# Patient Record
Sex: Female | Born: 1950 | Hispanic: Yes | Smoking: Never smoker
Health system: Western US, Academic
[De-identification: ages and names within clinical notes are randomized; demographics above are authoritative.]

## PROBLEM LIST (undated history)

## (undated) DIAGNOSIS — E119 Type 2 diabetes mellitus without complications: Secondary | ICD-10-CM

## (undated) DIAGNOSIS — I10 Essential (primary) hypertension: Secondary | ICD-10-CM

## (undated) DIAGNOSIS — C50919 Malignant neoplasm of unspecified site of unspecified female breast: Secondary | ICD-10-CM

## (undated) DIAGNOSIS — G43909 Migraine, unspecified, not intractable, without status migrainosus: Secondary | ICD-10-CM

## (undated) HISTORY — DX: Migraine, unspecified, not intractable, without status migrainosus: G43.909

## (undated) HISTORY — DX: Malignant neoplasm of unspecified site of unspecified female breast (CMS-HCC): C50.919

## (undated) HISTORY — DX: Essential (primary) hypertension: I10

## (undated) HISTORY — DX: Type 2 diabetes mellitus without complications (CMS-HCC): E11.9

## (undated) MED ORDER — LIDOCAINE HCL (PF) 1 % IJ SOLN
0.10 mL | INTRAMUSCULAR | Status: AC | PRN
Start: 2017-04-26 — End: ?

## (undated) MED ORDER — LACTATED RINGERS IV SOLN
INTRAVENOUS | Status: AC
Start: 2017-04-26 — End: ?

---

## 1994-03-15 HISTORY — PX: BREAST LUMPECTOMY: SHX2

## 1997-03-15 HISTORY — PX: TOTAL ABDOMINAL HYSTERECTOMY W/ BILATERAL SALPINGOOPHORECTOMY: SHX83

## 2016-11-01 ENCOUNTER — Other Ambulatory Visit: Payer: Self-pay

## 2016-11-19 ENCOUNTER — Other Ambulatory Visit: Payer: Self-pay

## 2017-03-17 ENCOUNTER — Telehealth (HOSPITAL_BASED_OUTPATIENT_CLINIC_OR_DEPARTMENT_OTHER): Payer: Self-pay

## 2017-03-17 DIAGNOSIS — C50911 Malignant neoplasm of unspecified site of right female breast: Principal | ICD-10-CM

## 2017-03-17 NOTE — Telephone Encounter (Signed)
Missing actual imaging reports.       Will fwd to Dr. Benjie Karvonen team to help schedule.

## 2017-03-17 NOTE — Telephone Encounter (Signed)
Nellie called from Dr. Clarita Crane' office. She left vm stating she faxed records and asks to be called to confirm receipt.

## 2017-03-17 NOTE — Telephone Encounter (Signed)
Returned call to [facility] and reached vm. I left message confirming receipt of the records and advised we would review.    Records reviewed and patient is being referred to Breast Surgical. Referral is entered also.    Routing to Breast Surgical team to contact patient.

## 2017-03-18 NOTE — Telephone Encounter (Signed)
Received all records, scanned into epic.

## 2017-03-21 NOTE — Telephone Encounter (Signed)
Spoke with Daughter Dedra Skeens [phone] wants to see Dr. Benjie Karvonen.

## 2017-03-22 NOTE — Telephone Encounter (Signed)
Path slides and imaging are requested.  Patient's daughter Dedra Skeens is contacted to schedule consultation with Dr. Benjie Karvonen, she is also supposed to be scheduled for consultation with a plastic surgeon.  Coordination of these appointments is discussed and Alma who was appreciative of this offering.  Forwarding to Dr. Benjie Karvonen for breast imaging orders and to [facility] for order to review path.  Referring pcp is contacted for referral to plastics.

## 2017-03-22 NOTE — Telephone Encounter (Signed)
Outside path slide review order entered in EPIC per protocol.

## 2017-03-23 ENCOUNTER — Other Ambulatory Visit: Payer: Self-pay

## 2017-03-24 ENCOUNTER — Other Ambulatory Visit
Admit: 2017-03-24 | Discharge: 2017-03-24 | Disposition: A | Discharge status: Home / Self Care | Payer: Medicare Other | Attending: Pathology | Admitting: Pathology

## 2017-03-24 DIAGNOSIS — C50911 Malignant neoplasm of unspecified site of right female breast: Principal | ICD-10-CM

## 2017-03-28 ENCOUNTER — Ambulatory Visit: Payer: Medicare Other | Attending: Surgical Oncology | Admitting: Surgical Oncology

## 2017-03-28 ENCOUNTER — Encounter (HOSPITAL_BASED_OUTPATIENT_CLINIC_OR_DEPARTMENT_OTHER): Payer: Self-pay | Admitting: Surgical Oncology

## 2017-03-28 ENCOUNTER — Other Ambulatory Visit (HOSPITAL_BASED_OUTPATIENT_CLINIC_OR_DEPARTMENT_OTHER): Payer: Self-pay | Admitting: Surgical Oncology

## 2017-03-28 ENCOUNTER — Ambulatory Visit (HOSPITAL_BASED_OUTPATIENT_CLINIC_OR_DEPARTMENT_OTHER)
Admit: 2017-03-28 | Discharge: 2017-03-28 | Disposition: A | Discharge status: Home Health | Payer: Medicare Other | Attending: Surgical Oncology | Admitting: Surgical Oncology

## 2017-03-28 ENCOUNTER — Ambulatory Visit (HOSPITAL_BASED_OUTPATIENT_CLINIC_OR_DEPARTMENT_OTHER)
Admission: RE | Admit: 2017-03-28 | Discharge: 2017-03-28 | Disposition: A | Discharge status: Home Health | Payer: Medicare Other | Attending: Surgical Oncology | Admitting: Surgical Oncology

## 2017-03-28 VITALS — BP 147/57 | HR 83 | Temp 98.5°F | Resp 16 | Ht <= 58 in | Wt 125.0 lb

## 2017-03-28 DIAGNOSIS — C773 Secondary and unspecified malignant neoplasm of axilla and upper limb lymph nodes: Secondary | ICD-10-CM | POA: Insufficient documentation

## 2017-03-28 DIAGNOSIS — C50911 Malignant neoplasm of unspecified site of right female breast: Principal | ICD-10-CM

## 2017-03-28 DIAGNOSIS — C50919 Malignant neoplasm of unspecified site of unspecified female breast: Principal | ICD-10-CM | POA: Insufficient documentation

## 2017-03-28 DIAGNOSIS — N6311 Unspecified lump in the right breast, upper outer quadrant: Secondary | ICD-10-CM

## 2017-03-28 DIAGNOSIS — N6310 Unspecified lump in the right breast, unspecified quadrant: Secondary | ICD-10-CM | POA: Insufficient documentation

## 2017-03-28 MED ORDER — VISION FORMULA 2 PO
ORAL | Status: DC
Start: ? — End: 2019-04-05

## 2017-03-28 MED ORDER — OMEPRAZOLE 40 MG OR CPDR: 40.00 mg | DELAYED_RELEASE_CAPSULE | Freq: Every day | ORAL | Status: AC

## 2017-03-28 MED ORDER — METFORMIN HCL 1000 MG OR TABS: 1000.00 mg | ORAL_TABLET | Freq: Two times a day (BID) | ORAL | Status: AC

## 2017-03-28 MED ORDER — LOSARTAN POTASSIUM-HCTZ 100-25 MG OR TABS: 1.00 | ORAL_TABLET | Freq: Every day | ORAL | Status: AC

## 2017-03-28 MED ORDER — CARBOXYMETH-GLYCERIN-POLYSORB 0.5-1-0.5 % OP SOLN: OPHTHALMIC | Status: AC

## 2017-03-28 MED ORDER — ASPIRIN 81 MG OR TABS
81.00 mg | ORAL_TABLET | Freq: Every day | ORAL | Status: DC
Start: ? — End: 2018-02-06

## 2017-03-28 MED ORDER — SIMVASTATIN 10 MG OR TABS: 10.00 mg | ORAL_TABLET | Freq: Every evening | ORAL | Status: AC

## 2017-03-28 MED ORDER — OMEGA-3 FISH OIL/VITAMIN D3 1000-1000 MG-UNIT PO CAPS
ORAL_CAPSULE | ORAL | Status: DC
Start: ? — End: 2018-02-06

## 2017-03-28 MED ORDER — ONDANSETRON HCL 4 MG OR TABS
4.00 mg | ORAL_TABLET | Freq: Three times a day (TID) | ORAL | Status: DC | PRN
Start: ? — End: 2018-02-06

## 2017-03-28 MED ORDER — GLIMEPIRIDE 4 MG OR TABS: 4.00 mg | ORAL_TABLET | Freq: Every morning | ORAL | Status: AC

## 2017-03-28 MED ORDER — LORAZEPAM 1 MG OR TABS
1.00 mg | ORAL_TABLET | ORAL | 0 refills | Status: AC
Start: 2017-03-28 — End: 2017-03-28

## 2017-03-28 NOTE — Patient Instructions (Signed)
[facility]  PHONE LIST FOR PATIENTS    Hours of operation:  Monday-Friday 8:00 - 5:00pm. Closed Holidays and Weekends    AFTER HOURS EMERGENCY NUMBER:    For urgent questions after hours, or on weekends/holidays, contact the [place] paging operator at [phone], and ask to speak to the doctor on call for Dr. Sarah Blair    To make a follow up appointment, please call the call the [facility] center at: [phone]     For scheduling issues, insurance or disability questions, please call,  Administrative Assistant Lori Morgan:  Phone: [phone], Fax: [phone]     For nursing questions during weekdays, please call:   Leva Baine, RN, BSN, Nurse Case Manager at [phone]    For urgent questions after hours, or on weekends/holidays, contact the [place] paging operator at [phone], and ask to speak to Dr. Sarah Blair, MD on call.    For Medication Refills, allow 3-4 days and fax request to [phone]   Attention: Agapita Savarino, RN    Social Worker: Laurie Knight, BS, MSW, LCSW   Phone: [phone]    Breast Cancer Support Group Schedule 2019  Each 3rd Wednesday of the month (Please note no group in Feb or Dec 2019)   2-3PM   [facility] (Follow the signs)   Group involves gentle movement, best to wear comfy clothes.   RSVP appreciated by calling facilitator Laurie Knight, LCSW [phone]      Information Desk [phone]   ** General information: directions, telephone numbers, or available services.       Administrative Assistant (Medical Oncology):  Kathleen Caldwell                                              Phone:  [phone]  Fax:  [phone]     Breast Cancer Patient Navigator:  Cecilia Kasperick, RN, MSN          Phone: [phone]     Family Cancer  Genetics : [phone]     Occupational Therapy : [phone]     [facility] [phone]   **Call to schedule  imaging appointments     Clinical Trials Office [phone]     [facility] [id number] , [place] [address]   You can also reach us by MyChart     Insurance Contracting: [phone]   MED CENTER Billing: [phone]   [facility] (Physician) Billing: [phone]   Marisela, MCC Financial Counselor: [phone] [email or web address]     For [facility] support services [email or web address]/[email or web address]/[email or web address]     For other support from the American Cancer Society, please visit: http://www.cancer.org/Treatment/SupportProgramsServices/index     Patient and Family Resource Center   Open daily; hours vary   Location: [facility], Room 1066, on the first floor just to the left of the lobby elevators   Phone [phone]

## 2017-03-28 NOTE — Interdisciplinary (Signed)
Consult appointment with Dr. Benjie Karvonen for newly diagnosed right breast cancer with unknown primary site.   Exam done and records reviewed.   Ambulatory. Alert and oriented x 3.   In no acute distress. Mood appropriate.     [place] Spanish interpreter, Marshia Ly, present to translate appt.     Nursing Assessment:   67 year old female who presented with a change on screening mammogram in 10/2016. Pt's last mammogram prior to this one was in 11/2012 Mass noted in 1996 on right breast and pt has lumpectomy and was found to be benign. Pt. Denies any changes in breast size, contour or symmetry, pruritis around breast, nipple discharge, or nipple inversion/direction.     11/01/2016 Bilateral screening mammogram done showing focal asymmetry int he upper right breast. BIRADS 0->    11/19/2016 Diagnostic right breast mammogram and US done showing two suspicious hypoechoic masses at 12:00 measuring 1 x 0.4 x 0.9 cm and 1.3 x 0.4 x 0.8 cm and one mass at 9:00 o'clock measuring 1.2 x 0.7 x 1.1 cm. A markedly abnormal LN in the right axilla is also seen measuring 2.9 x 2.6 x 2.7 cm. A benign 0.3 cm cyst at 10:00 o'clock is also noted. Bx of three masses and LN recommended. BIRADS 4 ->     12/28/16 Right breast mass biopsy:   Benign breast tissue showing stromal fibrosis, fibrocystic change and apocrine metaplasia     01/11/2017 Right axillary LN FNA:   Atypical epithelial cells, highly suspicious for malignancy     Outside slides received [address], [place], [place], "[id number]",01/19/17)   A: Right axillary lymph node, biopsy:   -Metastatic carcinoma, consistent with breast primary, see comment.   COMMENT: The tumor involves multiple fragments. A review of the outside immunohistochemical slides, with their appropriately reactive controls, showed the tumor to be strongly positive for GATA3 and weakly positive for CK7, supporting the rendered diagnosis.   The tumor also displayed the following immunoprofile:   Estrogen  receptor (ER): Positive (3+, >95%)   Progesterone receptor (PR): Negative   Her2/neu (IHC): Negative (score 0, ASCO/CAP 2018)     02/24/17 Right breast biopsy:   Benign breast tissue showing focal atypical ductal hyperplasia and stromal fibrosis.     Pt. Had bone scan and CT of chest done both negative for metastatic disease in 01/2017    Family History of Breast/Ovarian Cancer:   Unknown - pt. adopted    History of Family Cancer:   Unknown - pt. adopted.    Genetic Testing: Denies     Medical History:   Diabetes   HTN   Migraines    Surgical istory:   Lumpectomy in 1996  Hysterectomy and ovary removal in 1999 d/t benign tumor  C-section     Medications and Allergies: reviewed in EPIC     GYN History:   Menarche: 36  G 2P 2  Sexually Active: Not currently  Age at First Birth: 32  LMP: Hysterectomy in 1996  BCP: No  HRT: Yes, on pills x 1 month after hyspterectomy    Social History:     Widow   Occupation: Retired  Ashkenazi Jewish heritage: No   Smoking History: Denies   Alcohol use: Denies   Illicit Drugs: Denies     Interventions:   Supportive care given.   Personal difficulties related to treatment addressed to patient's satisfaction. Patient/family concerns addressed.     Education:   Contact information, AVS given and reviewed.   Questions  asked and answered to patient's satisfaction and understanding     Plan:    Labs ordered, to be done today   Mammogram and Ultrasound already completed  Breast Bx ordered  Breast MRI ordered   F/u after MRI and Bx complete for surgical planning

## 2017-03-28 NOTE — Progress Notes (Signed)
Surgical Oncology:  Demographics:  Date: March 28, 2017  Patient Name: [name]  Medical Record #: [id number]  DOB: 06-Nov-1950   Age: 67 year old    Sex: female    Referring Physician: Tollie Eth, MD     Reason for visit: new surgical consult breast cancer    History of Present Illness:   [name] is a 67 year old female who presents for evaluation of breast cancer with known axillary disease, unknown primary. She has a mass in the Right breast 9:00, N+4cm that measures 2.1cm which attempted biopsy twice at outside facility with benign/ADH findings. Had change on screening mammogram 10/2016, prior mammo 2014. Workup revealed three masses in Right breast and abnormal lymph node. Breast masses all returned benign on biopsy, except LN shows metastatic disease c/w breast primary.     [name] from live interpreter services was present for entire visit for Spanish translation.   Accompanied with her daughter [name]. [facility] recommended mastectomy since primary tumor could not be found on biopsy. They only referred her to [place] [facility] for reconstruction. Today in office she says she does not want to lose the breast, and will do what we recommend. Her daughter relays the pt's husband died 3 years ago from cancer and the patient is very scared and triggered by doctor visits. Daughter scared that waiting longer will allow cancer to spread.      ROS: Denies fevers, chills, unintentional wt loss, new HA, vision change, CP, cough, SOB, N/V, hematuria, dysuria, new masses/lumps, nipple d/c, extremity swelling, bone pain.       03/28/2017:  DIAGNOSTIC MAMMOGRAM WITH DIGITAL BREAST TOMOSYNTHESIS FINDINGS:  The breasts are heterogeneously dense, which may obscure small masses.  In the left breast there are no masses, asymmetries, or suspicious calcifications.  There is an irregular mass measuring 2.1 centimeters in the right breast at 9 o'clock located 4 centimeters from the nipple.  ULTRASOUND FINDINGS:  Real-time transverse and  longitudinal scans were obtained.  Sonography was performed in the right breast at 9 o'clock 4 cm from the nipple in transverse and longitudinal planes. Ultrasound demonstrates an irregular mass measuring 2.1 cm in the right breast at 9 o'clock located 4 centimeters from the nipple. Internal echogenicity is hypoechoic.  Sonography was performed in the right axilla in transverse and longitudinal planes. There are multiple abnormal lymph nodes measuring 2 centimeters in the right axilla.  IMPRESSION / RECOMMENDATION:  There is no evidence of malignancy in the left breast.  1. Mass in the right breast is highly suggestive of malignancy. Biopsy is recommended. There has been an attempt to biopsy this mass at outside facility and there are two marker clips anterior to this mass (not within this mass). one of the outside biopsies were benign and the other one showed ADH. Suggest repeating biopsy of the mass to determine the primary site of cancer.  2. Abnormal lymph nodes in the right axilla are a known malignancy.  Outside biopsy demonstrated metastatic disease in the axilla. Multiple abnormal lymph nodes are seen in the right axilla on today's exam.  ASSESSMENT: BI-RADS Category 5: Highly Suggestive of Malignancy    Pathology (see media), 01/19/2017 :  FINAL PATHOLOGIC DIAGNOSIS:  Outside slides received [address], [place], [place], [id number]  01/19/17)  A: Right axillary lymph node, biopsy:  -Metastatic carcinoma, consistent with breast primary, see comment.  COMMENT: The tumor involves multiple fragments. A review of the outside  immunohistochemical slides, with their appropriately reactive controls,  showed the  tumor to be strongly positive for GATA3 and weakly positive for  CK7, supporting the rendered diagnosis.  The tumor also displayed the following immunoprofile:  Estrogen receptor (ER): Positive (3+, >95%)  Progesterone receptor (PR): Negative  Her2/neu (IHC): Negative (score 0, ASCO/CAP  2018)  SPECIMEN(S) SUBMITTED: A: Outside slide review  CLINICAL HISTORY: 67 year old female with no clinical history. Please  review outside slides.  GROSS DESCRIPTION: A: Received for review from [facility], [place],  [place], are eight slides labeled [phone]" representing right axillary lymph  node biopsy from 01/19/17. Also received is a copy of the surgical  pathology report corresponding to the numbers on the slides. [name]  CONFIDENTIAL HEALTH INFORMATION: Health Care information is personal and  sensitive information. If it is being faxed to you it is done so under  appropriate authorization from the patient or under circumstances that do  not require patient authorization. You, the recipient, are obligated to  maintain it in a safe, secure and confidential manner. Re-disclosure  without additional patient consent or as permitted by law is prohibited.  Unauthorized re-disclosure or failure to maintain confidentiality could  subject you to penalties described in federal and state law. If you have  received this report or facsimile in error, please notify the [place]  [facility]ology Department immediately and destroy the received document(s).  cc: [facility]    Department of Pathology    [place]    [place], [place] [address]    Phone: [phone]  Final Diagnosis performed by Ronnell Guadalajara, M.D. [phone]) Electronically  signed 03/25/17 8:52  Interpretation performed at [facility],  [place], [place], [place], [address] Electronic Signature derived  from a single controlled access password.    Current Medications:    aspirin 81 MG tablet Take 81 mg by mouth daily.   Carboxymeth-Glycerin-Polysorb (REFRESH OPTIVE ADVANCED) 0.5-1-0.5 % SOLN    Fish Oil-Cholecalciferol (OMEGA-3 FISH OIL/VITAMIN D3) 1000-1000 MG-UNIT CAPS    glimepiride (AMARYL) 4 MG tablet Take 4 mg by mouth every morning.   LORazepam (ATIVAN) 1 MG tablet Take 1 tablet (1 mg) by mouth 1 hour before procedure  for 1 dose.   losartan-hydrochlorothiazide (HYZAAR) 100-25 MG tablet Take 1 tablet by mouth daily.   metFORMIN (GLUCOPHAGE) 1000 MG tablet Take 1,000 mg by mouth 2 times daily (with meals).   Multiple Vitamins-Minerals (VISION FORMULA 2 PO)    omeprazole (PRILOSEC) 40 MG capsule Take 40 mg by mouth daily.   ondansetron (ZOFRAN) 4 MG tablet Take 4 mg by mouth every 8 hours as needed for Nausea/Vomiting.   simvastatin (ZOCOR) 10 MG tablet Take 10 mg by mouth every evening.       Allergies:  Patient is allergic to morphine; phenylephrine; and benadryl [diphenhydramine].    Past Medical History:  Patient  has a past medical history of Diabetes mellitus (CMS-HCC), Hypertension, and Migraine.    Past Surgical History:  The patient's  has a past surgical history that includes Cesarean section, classic and Total abdominal hysterectomy w/ bilateral salpingoophorectomy.    Social History:  The patient's  reports that  has never smoked. she has never used smokeless tobacco. She reports that she does not drink alcohol or use drugs.    Family History:  Family History   Family history unknown: Yes       Personal Risk Factors:  Menarche @ age 69  LMP 14 s/p hysterectomy  G2 P2 A0  ; first delivery @ age 35  Denies smoking history  Denies alcohol use  HRT x 1 mo 1990s, stopped  Family History:    Surgical Oncologic History:  Stage at first diagnosis: breast cancer tXn1  Surgical Procedure: p  Chemotherapy: p  Radiation: p    Review of Systems:   See HPI for pertinent positives  Gen: denies fevers/chills, fatigue, unintentional weight loss, night sweats  HEENT: Denies dizziness, vision changes, loss of hearing, nosebleeds, dysphagia  CV: denies chest pain, palpitations, shortness of breath, orthopnea, edema  Resp: denies cough, shortness of breath, hemoptysis  Breast: denies erythema, inflammation, nipple discharge  GI: denies abdominal pain, nausea/vomiting, diarrhea/constipation, or bleeding  GU: denies dysuira, hematuria,  polyuria  Neuro: denies headache, dizziness, loss of consciousness    PHYSICAL EXAM:  BP 147/57 (BP Location: Right arm, BP Patient Position: Sitting, BP cuff size: Large)    Pulse 83    Temp 98.5 F (36.9 C) (Oral)    Resp 16    Ht 4' 5.5" (1.359 m)    Wt 56.7 kg (125 lb 0 oz)    LMP  (LMP Unknown)    SpO2 99%    BMI 30.70 kg/m    GENERAL APPEARANCE: The patient is an alert, female in no acute distress.  SKIN: Without visible lesion  EAR, NOSE, & THROAT:  NC, AT. EOMI. Oropharynx is clear.  LYMPH NODES: There is no cervical, supraclavicular, infraclavicular, or axillary adenopathy.  CHEST: The lungs are clear to auscultation bilaterally.  HEART: Regular rate and rhythm, no murmurs, rubs, or gallops appreciated.  BREASTS: Right breast ecchymosis post biopsy, no palpable masses nodularity nor density, no LAD appreciated, no nipple nor skin changes. Left breast no masses, no LAD, no skin nor nipple changes.   ABDOMEN:  The abdomen is soft, nondistended.  EXTREMITES: There is no edema or cyanosis.  NEURO:  Mental status is normal.  No focal neurologic findings.      ASSESSMENT/PLAN:  In summary Nova Schmuhl is a 67 year old woman who presents with Right breast cancer TXN1  1. Malignant neoplasm of breast, stage 2, unspecified laterality (CMS-HCC)  - CBC w/ Diff Lavender; Future  - Basic Metabolic Panel, Blood Green Plasma Separator Tube; Future  - LORazepam (ATIVAN) 1 MG tablet; Take 1 tablet (1 mg) by mouth 1 hour before procedure for 1 dose.  Dispense: 1 tablet; Refill: 0    - Recommend repeat biopsy of breast mass(es) with [facility].  - MRI ordered for evaluation of undiagnosed breast primary   - Reassured biopsy and MRI are critical to recommendations if pt wishes to attempted breast conservation     RTC after imaging and bx or sooner if needed     -Pt seen alongside Doyce Loose, PA-C who assisted with patient history, orders, counseling and education, and EMR charting. I was present for the entire duration  of the visit and performed the full physical exam in addition to detailing treatment plan. This chart has been reviewed and edited by me as needed.

## 2017-03-28 NOTE — Goals of Care (Signed)
Advance Care Planning    Do you have an Advanced Directive?   No     Would you like more information on Advanced Directives?   Yes    Who would make medical decisions for the patient if they are unable to make decisions for themselves?   Pt's oldest daughter    Based on above information I recommended the following:  completion of an advance directive and reviewing [email or web address]    Total time spent face-to-face with patient and/or surrogate decision maker providing counseling related to advance care planning:   5 minutes

## 2017-03-29 ENCOUNTER — Encounter (HOSPITAL_BASED_OUTPATIENT_CLINIC_OR_DEPARTMENT_OTHER): Payer: Self-pay | Admitting: Surgical Oncology

## 2017-03-29 ENCOUNTER — Other Ambulatory Visit (HOSPITAL_BASED_OUTPATIENT_CLINIC_OR_DEPARTMENT_OTHER): Payer: Self-pay | Admitting: Surgical Oncology

## 2017-03-29 DIAGNOSIS — C50919 Malignant neoplasm of unspecified site of unspecified female breast: Principal | ICD-10-CM

## 2017-03-29 DIAGNOSIS — R928 Other abnormal and inconclusive findings on diagnostic imaging of breast: Secondary | ICD-10-CM

## 2017-03-30 ENCOUNTER — Ambulatory Visit
Admit: 2017-03-30 | Discharge: 2017-03-30 | Disposition: A | Discharge status: Home / Self Care | Payer: Medicare Other | Attending: Anatomic Pathology & Clinical Pathology | Admitting: Anatomic Pathology & Clinical Pathology

## 2017-03-30 DIAGNOSIS — C50911 Malignant neoplasm of unspecified site of right female breast: Principal | ICD-10-CM

## 2017-04-05 ENCOUNTER — Ambulatory Visit (HOSPITAL_BASED_OUTPATIENT_CLINIC_OR_DEPARTMENT_OTHER): Payer: Medicare Other

## 2017-04-05 ENCOUNTER — Ambulatory Visit (HOSPITAL_BASED_OUTPATIENT_CLINIC_OR_DEPARTMENT_OTHER): Admit: 2017-04-05 | Payer: Medicare Other

## 2017-04-07 ENCOUNTER — Ambulatory Visit (HOSPITAL_BASED_OUTPATIENT_CLINIC_OR_DEPARTMENT_OTHER): Payer: Medicare Other

## 2017-04-11 ENCOUNTER — Other Ambulatory Visit (INDEPENDENT_AMBULATORY_CARE_PROVIDER_SITE_OTHER): Payer: Medicare Other

## 2017-04-11 ENCOUNTER — Ambulatory Visit
Admission: RE | Admit: 2017-04-11 | Discharge: 2017-04-11 | Disposition: A | Discharge status: Home / Self Care | Payer: Medicare Other | Attending: Diagnostic Radiology | Admitting: Diagnostic Radiology

## 2017-04-11 ENCOUNTER — Ambulatory Visit (HOSPITAL_BASED_OUTPATIENT_CLINIC_OR_DEPARTMENT_OTHER)
Admit: 2017-04-11 | Discharge: 2017-04-11 | Disposition: A | Discharge status: Home Health | Payer: Medicare Other | Attending: Surgical Oncology | Admitting: Surgical Oncology

## 2017-04-11 DIAGNOSIS — C50919 Malignant neoplasm of unspecified site of unspecified female breast: Secondary | ICD-10-CM | POA: Insufficient documentation

## 2017-04-11 DIAGNOSIS — Z9889 Other specified postprocedural states: Secondary | ICD-10-CM | POA: Insufficient documentation

## 2017-04-11 DIAGNOSIS — N6310 Unspecified lump in the right breast, unspecified quadrant: Secondary | ICD-10-CM | POA: Insufficient documentation

## 2017-04-11 DIAGNOSIS — C773 Secondary and unspecified malignant neoplasm of axilla and upper limb lymph nodes: Secondary | ICD-10-CM

## 2017-04-11 DIAGNOSIS — N6031 Fibrosclerosis of right breast: Secondary | ICD-10-CM | POA: Insufficient documentation

## 2017-04-11 DIAGNOSIS — Z4589 Encounter for adjustment and management of other implanted devices: Secondary | ICD-10-CM | POA: Insufficient documentation

## 2017-04-11 DIAGNOSIS — N6091 Unspecified benign mammary dysplasia of right breast: Secondary | ICD-10-CM

## 2017-04-11 DIAGNOSIS — R928 Other abnormal and inconclusive findings on diagnostic imaging of breast: Secondary | ICD-10-CM

## 2017-04-11 DIAGNOSIS — C50911 Malignant neoplasm of unspecified site of right female breast: Principal | ICD-10-CM

## 2017-04-11 LAB — CBC WITH DIFF, BLOOD
ANC-Automated: 5.2 10*3/uL (ref 1.6–7.0)
ANC-Instrument: 5.2 10*3/uL (ref 1.6–7.0)
Abs Basophils: 0 10*3/uL (ref <0.1)
Abs Eosinophils: 0.2 10*3/uL (ref 0.1–0.5)
Abs Lymphs: 2.9 10*3/uL (ref 0.8–3.1)
Abs Monos: 0.5 10*3/uL (ref 0.2–0.8)
Basophils: 0 %
Eosinophils: 3 %
Hct: 33.9 % — ABNORMAL LOW (ref 34.0–45.0)
Hgb: 11.6 gm/dL (ref 11.2–15.7)
Lymphocytes: 32 %
MCH: 30.2 pg (ref 26.0–32.0)
MCHC: 34.2 g/dL (ref 32.0–36.0)
MCV: 88.3 um3 (ref 79.0–95.0)
MPV: 10.3 fL (ref 9.4–12.4)
Monocytes: 6 %
Plt Count: 332 10*3/uL (ref 140–370)
RBC: 3.84 10*6/uL — ABNORMAL LOW (ref 3.90–5.20)
RDW: 12.5 % (ref 12.0–14.0)
Segs: 59 %
WBC: 8.9 10*3/uL (ref 4.0–10.0)

## 2017-04-11 LAB — BASIC METABOLIC PANEL, BLOOD
Anion Gap: 14 mmol/L (ref 7–15)
BUN: 15 mg/dL (ref 8–23)
Bicarbonate: 26 mmol/L (ref 22–29)
Calcium: 9.6 mg/dL (ref 8.5–10.6)
Chloride: 98 mmol/L (ref 98–107)
Creatinine: 0.75 mg/dL (ref 0.51–0.95)
GFR: >60 mL/min
Glucose: 135 mg/dL — ABNORMAL HIGH (ref 70–99)
Potassium: 3.8 mmol/L (ref 3.5–5.1)
Sodium: 138 mmol/L (ref 136–145)

## 2017-04-11 MED ORDER — GADOBUTROL 1 MMOL/ML IV SOLN
5.00 mL | Freq: Once | INTRAVENOUS | Status: AC
Start: 2017-04-11 — End: 2017-04-11
  Administered 2017-04-11: 5 mL via INTRAVENOUS

## 2017-04-14 ENCOUNTER — Ambulatory Visit (HOSPITAL_BASED_OUTPATIENT_CLINIC_OR_DEPARTMENT_OTHER): Payer: Medicare Other | Admitting: Surgical Oncology

## 2017-04-14 ENCOUNTER — Ambulatory Visit (HOSPITAL_BASED_OUTPATIENT_CLINIC_OR_DEPARTMENT_OTHER): Payer: Medicare Other

## 2017-04-21 ENCOUNTER — Ambulatory Visit (HOSPITAL_BASED_OUTPATIENT_CLINIC_OR_DEPARTMENT_OTHER): Payer: Medicare Other | Admitting: Surgical Oncology

## 2017-04-21 ENCOUNTER — Encounter (HOSPITAL_BASED_OUTPATIENT_CLINIC_OR_DEPARTMENT_OTHER): Payer: Self-pay | Admitting: Surgical Oncology

## 2017-04-21 ENCOUNTER — Ambulatory Visit
Admission: RE | Admit: 2017-04-21 | Discharge: 2017-04-21 | Disposition: A | Discharge status: Home / Self Care | Payer: Medicare Other | Attending: Diagnostic Radiology | Admitting: Diagnostic Radiology

## 2017-04-21 ENCOUNTER — Ambulatory Visit (HOSPITAL_BASED_OUTPATIENT_CLINIC_OR_DEPARTMENT_OTHER)
Admit: 2017-04-21 | Discharge: 2017-04-21 | Disposition: A | Discharge status: Home Health | Payer: Medicare Other | Attending: Surgical Oncology | Admitting: Surgical Oncology

## 2017-04-21 ENCOUNTER — Other Ambulatory Visit (HOSPITAL_BASED_OUTPATIENT_CLINIC_OR_DEPARTMENT_OTHER): Payer: Self-pay | Admitting: Surgical Oncology

## 2017-04-21 VITALS — BP 182/71 | HR 87 | Temp 98.0°F | Resp 16 | Ht <= 58 in | Wt 125.4 lb

## 2017-04-21 DIAGNOSIS — Z9889 Other specified postprocedural states: Secondary | ICD-10-CM | POA: Insufficient documentation

## 2017-04-21 DIAGNOSIS — Z4589 Encounter for adjustment and management of other implanted devices: Secondary | ICD-10-CM | POA: Insufficient documentation

## 2017-04-21 DIAGNOSIS — C50919 Malignant neoplasm of unspecified site of unspecified female breast: Secondary | ICD-10-CM | POA: Insufficient documentation

## 2017-04-21 DIAGNOSIS — N649 Disorder of breast, unspecified: Secondary | ICD-10-CM

## 2017-04-21 NOTE — Patient Instructions (Signed)
[facility]  PHONE LIST FOR PATIENTS    Hours of operation:  Monday-Friday 8:00 - 5:00pm. Closed Holidays and Weekends    AFTER HOURS EMERGENCY NUMBER:    For urgent questions after hours, or on weekends/holidays, contact the [place] paging operator at [phone], and ask to speak to the doctor on call for Dr. Sarah Blair    To make a follow up appointment, please call the call the [facility] center at: [phone]    For scheduling issues, insurance or disability questions, please call,  Administrative Assistant Lori Morgan:  Phone: [phone], Fax: [phone]     For nursing questions during weekdays, please call:   Jacorie Ernsberger, RN, BSN, Nurse Case Manager at [phone]  *Keshun Berrett works Monday - Thursday but there is always a covering nurse covering my phone on Friday's that you can still call     For Medication Refills, allow 3-4 days and fax request to [phone]   Attention: Deklen Popelka, RN    Social Worker: Laurie Knight, BS, MSW, LCSW   Phone: [phone]    Breast Cancer Support Group Schedule 2019  Each 3rd Wednesday of the month (Please note no group in Feb or Dec 2019)   2-3PM   [facility] (Follow the signs)   Group involves gentle movement, best to wear comfy clothes.   RSVP appreciated by calling facilitator Laurie Knight, LCSW [phone]      Information Desk [phone]   ** General information: directions, telephone numbers, or available services.       [facility]: [phone]   **Call to schedule imaging appointments     Administrative Assistant (Medical Oncology):  Kathleen Caldwell                                           Phone:  [phone]  Fax:  [phone]     Breast Cancer Patient Navigator:  Cecilia Kasperick, RN, MSN          Phone: [phone]     Family Cancer  Genetics : [phone]     Occupational Therapy : [phone]     Clinical Trials  Office [phone]     [facility] [address] , [place], [place] [address]      Insurance Contracting: [phone]   MED CENTER Billing: [phone]   [facility] (Physician) Billing: [phone]   Marisela, MCC Financial Counselor: [phone] [email or web address]     For [facility] support services [email or web address]/outreach/[email or web address]/support.asp     For other support from the American Cancer Society, please visit: http://www.cancer.org/Treatment/SupportProgramsServices/index     Patient and Family Resource Center   Open daily; hours vary   Location: [facility], Room 1066, on the first floor just to the left of the lobby elevators   Phone [phone]

## 2017-04-21 NOTE — Progress Notes (Signed)
Surgical Oncology:  Demographics:  Date: April 21, 2017  Patient Name: [name]  Medical Record #: [id number]  DOB: [birth year]   Age: 67 year old    Sex: female    Referring Physician: Tollie Eth, MD     Interpreter id [phone]    Reason for visit: f/u     History of Present Illness:   [name] is a 67 year old female who presents for evaluation of breast cancer with known axillary disease, unknown primary. She has a mass in the Right breast 9:00, N+4cm that measures 2.1cm which attempted biopsy twice at outside facility with benign/ADH findings. Had change on screening mammogram 10/2016, prior mammo 2014. Workup revealed three masses in Right breast and abnormal lymph node. Breast masses all returned benign on biopsy, except LN shows metastatic disease c/w breast primary. Repeat bx in the breast at [facility] was also benign.  We discussed in these cases of + axillary node with unknown primary there is no difference in survival between mast + ALND vs radn and ALND accoeding to small studies.  She wants BCT so I recommended we remove that area with atypia + ALND.  I discussed the case with Dr. Wetzel Bjornstad and she was in favor of surgery first in order to know the number of positive nodes to help guide decision re; chemo       ROS: Denies fevers, chills, unintentional wt loss, new HA, vision change, CP, cough, SOB, N/V, hematuria, dysuria, new masses/lumps, nipple d/c, extremity swelling, bone pain.       03/28/2017:  DIAGNOSTIC MAMMOGRAM WITH DIGITAL BREAST TOMOSYNTHESIS FINDINGS:  The breasts are heterogeneously dense, which may obscure small masses.  In the left breast there are no masses, asymmetries, or suspicious calcifications.  There is an irregular mass measuring 2.1 centimeters in the right breast at 9 o'clock located 4 centimeters from the nipple.  ULTRASOUND FINDINGS:  Real-time transverse and longitudinal scans were obtained.  Sonography was performed in the right breast at 9 o'clock 4 cm from the nipple in  transverse and longitudinal planes. Ultrasound demonstrates an irregular mass measuring 2.1 cm in the right breast at 9 o'clock located 4 centimeters from the nipple. Internal echogenicity is hypoechoic.  Sonography was performed in the right axilla in transverse and longitudinal planes. There are multiple abnormal lymph nodes measuring 2 centimeters in the right axilla.  IMPRESSION / RECOMMENDATION:  There is no evidence of malignancy in the left breast.  1. Mass in the right breast is highly suggestive of malignancy. Biopsy is recommended. There has been an attempt to biopsy this mass at outside facility and there are two marker clips anterior to this mass (not within this mass). one of the outside biopsies were benign and the other one showed ADH. Suggest repeating biopsy of the mass to determine the primary site of cancer.  2. Abnormal lymph nodes in the right axilla are a known malignancy.  Outside biopsy demonstrated metastatic disease in the axilla. Multiple abnormal lymph nodes are seen in the right axilla on today's exam.  ASSESSMENT: BI-RADS Category 5: Highly Suggestive of Malignancy    Pathology (see media), 01/19/2017 :  FINAL PATHOLOGIC DIAGNOSIS:  Outside slides received [address], [place], [place], [id number]",  01/19/17)  A: Right axillary lymph node, biopsy:  -Metastatic carcinoma, consistent with breast primary, see comment.  COMMENT: The tumor involves multiple fragments. A review of the outside  immunohistochemical slides, with their appropriately reactive controls,  showed the tumor to be strongly positive  for GATA3 and weakly positive for  CK7, supporting the rendered diagnosis.  The tumor also displayed the following immunoprofile:  Estrogen receptor (ER): Positive (3+, >95%)  Progesterone receptor (PR): Negative  Her2/neu (IHC): Negative (score 0, ASCO/CAP 2018)  SPECIMEN(S) SUBMITTED: A: Outside slide review  CLINICAL HISTORY: 67 year old female with no clinical history.  Please  review outside slides.  GROSS DESCRIPTION: A: Received for review from [facility], [place],  [place], are eight slides labeled "[id number]" representing right axillary lymph  node biopsy from 01/19/17. Also received is a copy of the surgical  pathology report corresponding to the numbers on the slides. [name]  CONFIDENTIAL HEALTH INFORMATION: Health Care information is personal and  sensitive information. If it is being faxed to you it is done so under  appropriate authorization from the patient or under circumstances that do  not require patient authorization. You, the recipient, are obligated to  maintain it in a safe, secure and confidential manner. Re-disclosure  without additional patient consent or as permitted by law is prohibited.  Unauthorized re-disclosure or failure to maintain confidentiality could  subject you to penalties described in federal and state law. If you have  received this report or facsimile in error, please notify the [facility]  Pathology Department immediately and destroy the received document(s).  cc: [facility]    Department of Pathology    [place]    [place], [place] [address]    Phone: [phone]  Final Diagnosis performed by Ronnell Guadalajara, M.D. [phone]) Electronically  signed 03/25/17 8:52  Interpretation performed at [place],  [place], [place], [place], [address] Electronic Signature derived  from a single controlled access password.    Current Medications:    aspirin 81 MG tablet Take 81 mg by mouth daily.   Carboxymeth-Glycerin-Polysorb (REFRESH OPTIVE ADVANCED) 0.5-1-0.5 % SOLN    Fish Oil-Cholecalciferol (OMEGA-3 FISH OIL/VITAMIN D3) 1000-1000 MG-UNIT CAPS    glimepiride (AMARYL) 4 MG tablet Take 4 mg by mouth every morning.   losartan-hydrochlorothiazide (HYZAAR) 100-25 MG tablet Take 1 tablet by mouth daily.   metFORMIN (GLUCOPHAGE) 1000 MG tablet Take 1,000 mg by mouth 2 times daily (with meals).   Multiple Vitamins-Minerals  (VISION FORMULA 2 PO)    omeprazole (PRILOSEC) 40 MG capsule Take 40 mg by mouth daily.   ondansetron (ZOFRAN) 4 MG tablet Take 4 mg by mouth every 8 hours as needed for Nausea/Vomiting.   simvastatin (ZOCOR) 10 MG tablet Take 10 mg by mouth every evening.       Allergies:  Patient is allergic to morphine; phenylephrine; and benadryl [diphenhydramine].    Past Medical History:  Patient  has a past medical history of Diabetes mellitus (CMS-HCC), Hypertension, and Migraine.    Past Surgical History:  The patient's  has a past surgical history that includes Cesarean section, classic; Total abdominal hysterectomy w/ bilateral salpingoophorectomy (1999); and Breast lumpectomy (1996).    Social History:  The patient's  reports that  has never smoked. she has never used smokeless tobacco. She reports that she does not drink alcohol or use drugs.    Family History:  Family History   Adopted: Yes   Family history unknown: Yes       Personal Risk Factors:  Menarche @ age 54  LMP 54 s/p hysterectomy  G2 P2 A0  ; first delivery @ age 52  Denies smoking history  Denies alcohol use  HRT x 1 mo 1990s, stopped  Family History:  Surgical Oncologic History:  Stage at first diagnosis: breast cancer tXn1  Surgical Procedure: lt image loc alnd  Chemotherapy: p  Radiation: p    Review of Systems:   See HPI for pertinent positives  Gen: denies fevers/chills, fatigue, unintentional weight loss, night sweats  HEENT: Denies dizziness, vision changes, loss of hearing, nosebleeds, dysphagia  CV: denies chest pain, palpitations, shortness of breath, orthopnea, edema  Resp: denies cough, shortness of breath, hemoptysis  Breast: denies erythema, inflammation, nipple discharge  GI: denies abdominal pain, nausea/vomiting, diarrhea/constipation, or bleeding  GU: denies dysuira, hematuria, polyuria  Neuro: denies headache, dizziness, loss of consciousness    PHYSICAL EXAM:  LMP  (LMP Unknown)    GENERAL APPEARANCE: The patient is an alert, female  in no acute distress.  SKIN: Without visible lesion  EAR, NOSE, & THROAT:  NC, AT. EOMI. Oropharynx is clear.  LYMPH NODES: There is no cervical, supraclavicular, infraclavicular, or axillary adenopathy.  CHEST: The lungs are clear to auscultation bilaterally.  HEART: Regular rate and rhythm, no murmurs, rubs, or gallops appreciated.  BREASTS: Right breast ecchymosis post biopsy, no palpable masses nodularity nor density, no LAD appreciated, no nipple nor skin changes. Left breast no masses, no LAD, no skin nor nipple changes.   ABDOMEN:  The abdomen is soft, nondistended.  EXTREMITES: There is no edema or cyanosis.  NEURO:  Mental status is normal.  No focal neurologic findings.      ASSESSMENT/PLAN:  In summary Rinoa Garramone is a 67 year old woman who presents with Right breast cancer TXN1  1. Malignant neoplasm of breast, stage 2, unspecified laterality (CMS-HCC)  1. We decided on image guided lump of atypia and ALND.   I told over that there is no difference in survival if she has mastectomy vs lumpectomy plus radiation. We discussed the risks of bleeding, infection, cosmetic deformity, sensory change ,  lymphedema, missing clip. Additional we discussed  The  risks of surgery including: damage to surrounding structures, wound dehiscence, need for repeat surgery, and associated risks of anaesthesia including myocardial infarction, stroke, deep vein thrombosis, pulmonary embolism, and death were discussed with the patient, and the patient understands these risks. We will get her scheduled She understands that she will be in the hospital overnight and she will have drains for 7-14 days

## 2017-04-21 NOTE — Interdisciplinary (Signed)
Follow up appointment with Dr. Benjie Karvonen   Exam done and records reviewed.   Ambulatory. Alert and oriented x [place] interpreter, Everlene Farrier, present to translate appt.     Nursing assessment:   67 year old female with breast cancer ere her for surgical planning  Pain: denies  Constipation: denies  Fatigue: denies  Appetite: Good   ROM: Good  Recent imaging result reviewed with the patient by Dr. Benjie Karvonen    Wellbeing Assessment :   Patient accompanied by daughter who is translating appt. [place] interpreting services offered and refused.   Transportation issue: None  Barriers to learning: None.  Emotional support given     Interventions:  Supportive care given. Personal difficulties related to treatment addressed to patient's satisfaction. Patient/family concerns addressed.     Education:  Right breast image guided lumpectomy + ALND on educational materials reviewed with and given to pt. With good understanding  Contact information, AVS given and reviewed. Questions asked and answered to patient's satisfaction and understanding    Plan:  -Pt. Consented for Right breast image guided lumpectomy + ALND  -surgery as scheduled  -post-op as scheduled

## 2017-04-26 ENCOUNTER — Encounter (HOSPITAL_BASED_OUTPATIENT_CLINIC_OR_DEPARTMENT_OTHER): Payer: Self-pay | Admitting: Family

## 2017-04-26 ENCOUNTER — Encounter (INDEPENDENT_AMBULATORY_CARE_PROVIDER_SITE_OTHER): Payer: Self-pay | Admitting: Family

## 2017-04-26 ENCOUNTER — Ambulatory Visit: Payer: Medicare Other | Attending: Family | Admitting: Family

## 2017-04-26 DIAGNOSIS — Z01818 Encounter for other preprocedural examination: Secondary | ICD-10-CM

## 2017-04-26 NOTE — Anesthesia Preprocedure Evaluation (Addendum)
ANESTHESIA PRE-OPERATIVE EVALUATION    Patient Information    Name: [name]    MRN: [id number]    DOB: 01-22-1951    Age: 67 year old    Sex: female  Procedure(s):  RIGHT SAVI LOCALIZED LUMPECTOMY  RIGHT AXILLARY LYMPH NODE DISSECTION      Pre-op Vitals:   LMP  (LMP Unknown)         Primary language spoken:  Spanish    ROS/Medical History:       History of Present Illness: 67 y/o F with malignant neoplasm or right breast scheduled for RIGHT SAVI LOCALIZED LUMPECTOMY , RIGHT AXILLARY LYMPH NODE DISSECTION    Phone visit  I spoke with patient in [place] #[id number] [phone]    General:  negative for General ROS   Cardiovascular:  hypertension,  Pacemaker:   >4 METS, walks and completes ADL's, helps at restaurant 1x/wk  No cp or sob   Anesthesia History:  negative anesthesia history ROS   Pulmonary:   negative pulmonary ROS     Neuro/Psych:   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                      Phone interview only   Reviewed AVS  Sent  AVS to patients email  CHG instructions provided               Physical Exam    Airway:      Cardiovascular:      Pulmonary:      Neuro/Neck/Skeletal/Skin:      Dental:      Abdominal:          Additional Clinical Notes:   Other Physical Exam Findings: NO PE DONE            Last  OSA (STOP BANG) Score:  No Data Recorded    Last OSA  (STOP) Score for   No Data Recorded      Has a physician diagnosed you with sleep apnea?: No  Do you use a CPAP at home?: No  OSA total score (A score of 2 or more is high risk. Offer patient sleep study.): 2    Past Medical History:   Diagnosis Date    Diabetes mellitus  (CMS-HCC)     Hypertension     Migraine      Past Surgical History:   Procedure Laterality Date    BREAST LUMPECTOMY  1996    benign mass    CESAREAN SECTION, CLASSIC      TOTAL ABDOMINAL HYSTERECTOMY W/ BILATERAL SALPINGOOPHORECTOMY  1999    d/t benign tumor     Social History     Tobacco Use    Smoking status: Never Smoker    Smokeless tobacco: Never Used   Substance Use Topics    Alcohol use: No     Frequency: Never    Drug use: No       Current Outpatient Medications   Medication Sig Dispense Refill  aspirin 81 MG tablet Take 81 mg by mouth daily.      Carboxymeth-Glycerin-Polysorb (REFRESH OPTIVE ADVANCED) 0.5-1-0.5 % SOLN       Fish Oil-Cholecalciferol (OMEGA-3 FISH OIL/VITAMIN D3) 1000-1000 MG-UNIT CAPS       glimepiride (AMARYL) 4 MG tablet Take 4 mg by mouth every morning.      losartan-hydrochlorothiazide (HYZAAR) 100-25 MG tablet Take 1 tablet by mouth daily.      metFORMIN (GLUCOPHAGE) 1000 MG tablet Take 1,000 mg by mouth 2 times daily (with meals).      Multiple Vitamins-Minerals (VISION FORMULA 2 PO)       omeprazole (PRILOSEC) 40 MG capsule Take 40 mg by mouth daily.      ondansetron (ZOFRAN) 4 MG tablet Take 4 mg by mouth every 8 hours as needed for Nausea/Vomiting.      simvastatin (ZOCOR) 10 MG tablet Take 10 mg by mouth every evening.       No current facility-administered medications for this visit.      Allergies   Allergen Reactions    Morphine Anaphylaxis and Nausea and Vomiting    Phenylephrine Anaphylaxis and Nausea and Vomiting    Benadryl [Diphenhydramine] Nausea Only       Labs and Other Data  Lab Results   Component Value Date    NA 138 04/11/2017    K 3.8 04/11/2017    CL 98 04/11/2017    BICARB 26 04/11/2017    BUN 15 04/11/2017    CREAT 0.75 04/11/2017    GLU 135 (H) 04/11/2017    Roxana 9.6 04/11/2017     No results found for: AST, ALT, GGT, LDH, ALK, TP, ALB, TBILI, DBILI  Lab Results   Component Value Date    WBC 8.9 04/11/2017    RBC 3.84 (L) 04/11/2017    HGB  11.6 04/11/2017    HCT 33.9 (L) 04/11/2017    MCV 88.3 04/11/2017    MCHC 34.2 04/11/2017    RDW 12.5 04/11/2017    PLT 332 04/11/2017    MPV 10.3 04/11/2017    SEG 59 04/11/2017    LYMPHS 32 04/11/2017    MONOS 6 04/11/2017    EOS 3 04/11/2017    BASOS 0 04/11/2017     No results found for: INR, PTT  No results found for: ARTPH, ARTPO2, ARTPCO2    Anesthesia Plan:  Risks and Benefits of Anesthesia  I personally examined the patient immediately prior to the anesthetic and reviewed the pertinent medical history, drug and allergy history, laboratory and imaging studies and consultations. I have determined that the patient has had adequate assessment and testing.    Anesthetic techniques, invasive monitors, anesthetic drugs for induction, maintenance and post-operative analgesia, risks and alternatives have been explained to the patient and/or patient's representatives.    I have prescribed the anesthetic plan:         Planned anesthesia method: General         ASA 3 (Severe systemic disease)     Potential anesthesia problems identified and risks including but not limited to the following were discussed with patient and/or patient's representative: Adverse or allergic drug reaction, Administration of blood products, Recall, Ocular injury, Dental injury or sore throat and Nerve injury    Planned monitoring method: Routine monitoring  Comments: (D/W pt anesthesia options, to be determined dos by provider  )    Informed Consent:  Anesthetic plan and risks discussed with Patient.

## 2017-04-26 NOTE — Patient Instructions (Signed)
[facility] QUIRRGICA PREOPERATORIA     Su operacin est actualmente [employer] [facility] [place] en 05/03/17  Elodia Florence de presentacin planeada es por su [place] de [place], [place], [address], [place], [place] [address]   ? Regstrese en Servicios para pacientes (Patient Services) en el vestbulo del 1er piso.           PREGUNTAS  Si tiene preguntas entre ahora y Games developer de la operacin, no dude en llamarnos:       [facility] Pre[facility] [place]: [place] EL DA DE LA OPERACIN:  El da de la operacin/procedimiento, le rogamos que llegue a la hora que le haya indicado el equipo quirrgico/del preoperatorio. Si tiene preguntas sobre la hora de llegada, llmenos:      Ingresos quirrgicos preoperatorios (Preoperative Surgical Admissions) en [employer] [facility] [place] [phone]                 INSTRUCCIONES PARA LOS MEDICAMENTOS:      MEDICAMENTOS QUE DEBEN SUSPENDERSE 7 DAS ANTES DE LA OPERACIN O PROCEDIMIENTO:       DEJE DE TOMAR TODOS LOS AINEs (anti-inflamatorios no esteroideos) COMO: Advil, Aleve, Motrin, ibuprofeno, relafeno, yodo, feldeno, Diclofenac, voltareno, indometacina, naproxeno, celebrex, Mobic.        Deje de tomar vitaminas, suplementos, hierbas y aceites de pescado.      MEDICAMENTOS CON RECETA REGULARES:       Los medicamentos con receta regulares deben tomarse el da de la operacin con sorbos de agua EXCEPTO METFORMIN, GLIMEPIRIDE, HYZAAR       DESPUS DE SU VISITA A NUESTRO CENTRO, SI EMPIEZA A TOMAR UN NUEVO MEDICAMENTO ANTES DE [place], [place] Y QUE NO TENDR [place] EN SU [place].            COMIDA/BEBIDA       POR FAVOR, NO COMA NI BEBA NADA DESPUS DE LA MEDIANOCHE LA NOCHE ANTES DE LA OPERACIN.     Preparacin para la operacin:       Lleve ropa limpia y [place], y deje los objetos de Neurosurgeon.        No se afeite ni se quite el vello corporal. Se permite afeitarse la cara. Si se va a someter a [place] de cabeza, pregunte a su mdico si puede afeitarse.    Ane Payment tarjeta de identificacin con foto y su tarjeta del seguro, y est preparado para pagar su deducible o el coseguro en efectivo, cheque o tarjeta de crdito al llegar.     Si se va a ir a casa despus de la operacin, asegrese de tener un adulto que le lleve en auto a casa. Si no lo tiene, la operacin podra cancelarse.      El da de la operacin:        Regstrese en el lugar indicado anteriormente.     Si es Art therapist en edad de Media planner, es posible que se le pida que entregue una muestra de orina al Engineer, technical sales.    Se reunir con los equipos quirrgico y de anestesia antes de la operacin.     Una vez terminada la operacin, se despertar en la sala de recuperacin donde podr ver a sus amigos y familiares.     Una vez que finalice su tiempo  en la sala de recuperacin, se ir a casa o se le ingresar, segn lo planeado.    ? Si se va a casa, alguien necesitar estar con usted las primeras 24 horas despus de la operacin.      Hay a su disposicin informacin adicional sobre lo que puede esperar antes y despus de la operacin en lnea en:   https://youtu.be/[email or web address] para [place].     O buscando en YouTube: [place] before surgery ([place]: antes de la operacin) y [place] after surgery ([place]: despus de la operacin).     Sus expedientes mdicos estn a su disposicin en [email or web address]  Seleccione Create account (Crear cuenta).

## 2017-05-03 ENCOUNTER — Inpatient Hospital Stay
Admission: RE | Admit: 2017-05-03 | Discharge: 2017-05-04 | DRG: 580 | Disposition: A | Discharge status: Home / Self Care | Payer: Medicare Other | Attending: Surgical Oncology | Admitting: Surgical Oncology

## 2017-05-03 ENCOUNTER — Inpatient Hospital Stay (HOSPITAL_COMMUNITY): Payer: Medicare Other | Admitting: Anesthesiology

## 2017-05-03 ENCOUNTER — Inpatient Hospital Stay (HOSPITAL_COMMUNITY): Payer: Medicare Other

## 2017-05-03 ENCOUNTER — Encounter (HOSPITAL_COMMUNITY): Admission: RE | Disposition: A | Discharge status: Home Health | Payer: Self-pay | Attending: Surgical Oncology

## 2017-05-03 ENCOUNTER — Inpatient Hospital Stay (HOSPITAL_COMMUNITY): Payer: Medicare Other | Admitting: Family

## 2017-05-03 ENCOUNTER — Encounter (HOSPITAL_COMMUNITY): Payer: Self-pay

## 2017-05-03 DIAGNOSIS — C801 Malignant (primary) neoplasm, unspecified: Secondary | ICD-10-CM

## 2017-05-03 DIAGNOSIS — Z4589 Encounter for adjustment and management of other implanted devices: Secondary | ICD-10-CM

## 2017-05-03 DIAGNOSIS — Z7982 Long term (current) use of aspirin: Secondary | ICD-10-CM

## 2017-05-03 DIAGNOSIS — Z90722 Acquired absence of ovaries, bilateral: Secondary | ICD-10-CM

## 2017-05-03 DIAGNOSIS — C50911 Malignant neoplasm of unspecified site of right female breast: Principal | ICD-10-CM | POA: Diagnosis present

## 2017-05-03 DIAGNOSIS — N6011 Diffuse cystic mastopathy of right breast: Secondary | ICD-10-CM

## 2017-05-03 DIAGNOSIS — Z90710 Acquired absence of both cervix and uterus: Secondary | ICD-10-CM

## 2017-05-03 DIAGNOSIS — Z9889 Other specified postprocedural states: Secondary | ICD-10-CM

## 2017-05-03 DIAGNOSIS — I10 Essential (primary) hypertension: Secondary | ICD-10-CM | POA: Diagnosis present

## 2017-05-03 DIAGNOSIS — E119 Type 2 diabetes mellitus without complications: Secondary | ICD-10-CM | POA: Diagnosis present

## 2017-05-03 DIAGNOSIS — G43909 Migraine, unspecified, not intractable, without status migrainosus: Secondary | ICD-10-CM | POA: Diagnosis present

## 2017-05-03 DIAGNOSIS — C773 Secondary and unspecified malignant neoplasm of axilla and upper limb lymph nodes: Secondary | ICD-10-CM | POA: Diagnosis present

## 2017-05-03 DIAGNOSIS — Z789 Other specified health status: Secondary | ICD-10-CM

## 2017-05-03 DIAGNOSIS — Z885 Allergy status to narcotic agent status: Secondary | ICD-10-CM

## 2017-05-03 DIAGNOSIS — N6091 Unspecified benign mammary dysplasia of right breast: Secondary | ICD-10-CM

## 2017-05-03 DIAGNOSIS — G8918 Other acute postprocedural pain: Secondary | ICD-10-CM

## 2017-05-03 DIAGNOSIS — K5903 Drug induced constipation: Secondary | ICD-10-CM

## 2017-05-03 DIAGNOSIS — Z7984 Long term (current) use of oral hypoglycemic drugs: Secondary | ICD-10-CM

## 2017-05-03 DIAGNOSIS — Z888 Allergy status to other drugs, medicaments and biological substances status: Secondary | ICD-10-CM

## 2017-05-03 LAB — GLYCOSYLATED HGB(A1C), BLOOD: Glyco Hgb (A1C): 7.2 % — ABNORMAL HIGH (ref 4.8–5.8)

## 2017-05-03 LAB — GLUCOSE (POCT)
Glucose (POCT): 151 mg/dL — ABNORMAL HIGH (ref 70–99)
Glucose (POCT): 165 mg/dL — ABNORMAL HIGH (ref 70–99)
Glucose (POCT): 216 mg/dL — ABNORMAL HIGH (ref 70–99)
Glucose (POCT): 286 mg/dL — ABNORMAL HIGH (ref 70–99)

## 2017-05-03 SURGERY — BIOPSY, BREAST, WITH LUMPECTOMY
Anesthesia: General | Site: Breast | Wound class: Class I (Clean)

## 2017-05-03 MED ORDER — FAMOTIDINE 20 MG OR TABS
20.0000 mg | ORAL_TABLET | Freq: Two times a day (BID) | ORAL | Status: DC
Start: 2017-05-03 — End: 2017-05-04
  Administered 2017-05-03 – 2017-05-04 (×2): 20 mg via ORAL
  Filled 2017-05-03 (×2): qty 1

## 2017-05-03 MED ORDER — OXYCODONE HCL 5 MG OR TABS
5.0000 mg | ORAL_TABLET | Freq: Once | ORAL | Status: DC | PRN
Start: 2017-05-03 — End: 2017-05-03

## 2017-05-03 MED ORDER — OXYCODONE HCL 5 MG OR TABS
2.5000 mg | ORAL_TABLET | ORAL | Status: DC | PRN
Start: 2017-05-03 — End: 2017-05-04

## 2017-05-03 MED ORDER — GABAPENTIN 300 MG OR CAPS
300.0000 mg | ORAL_CAPSULE | Freq: Three times a day (TID) | ORAL | Status: DC
Start: 2017-05-04 — End: 2017-05-04
  Administered 2017-05-04: 300 mg via ORAL
  Filled 2017-05-03: qty 1

## 2017-05-03 MED ORDER — DEXTROSE (DIABETIC USE) 40 % OR GEL
1.0000 | ORAL | Status: DC | PRN
Start: 2017-05-03 — End: 2017-05-04

## 2017-05-03 MED ORDER — EPHEDRINE SULFATE 50 MG/ML IJ SOLN
INTRAMUSCULAR | Status: DC | PRN
Start: 2017-05-03 — End: 2017-05-03
  Administered 2017-05-03 (×2): 10 mg via INTRAVENOUS

## 2017-05-03 MED ORDER — ROPIVACAINE HCL 5 MG/ML IJ SOLN
INTRAMUSCULAR | Status: DC | PRN
Start: 2017-05-03 — End: 2017-05-03
  Administered 2017-05-03 (×3): 10 mL via PERINEURAL

## 2017-05-03 MED ORDER — FENTANYL CITRATE (PF) 100 MCG/2ML IJ SOLN
25.0000 ug | INTRAMUSCULAR | Status: DC | PRN
Start: 2017-05-03 — End: 2017-05-03

## 2017-05-03 MED ORDER — LIDOCAINE HCL (PF) 1 % IJ SOLN
0.1000 mL | INTRAMUSCULAR | Status: DC | PRN
Start: 2017-05-03 — End: 2017-05-03

## 2017-05-03 MED ORDER — FAMOTIDINE 20 MG/2ML IV SOLN
INTRAVENOUS | Status: DC | PRN
Start: 2017-05-03 — End: 2017-05-03
  Administered 2017-05-03 (×2): 20 mg via INTRAVENOUS

## 2017-05-03 MED ORDER — PROPOFOL 1000 MG/100ML IV EMUL
INTRAVENOUS | Status: DC | PRN
Start: 2017-05-03 — End: 2017-05-03
  Administered 2017-05-03: 75 ug/kg/min via INTRAVENOUS
  Administered 2017-05-03: 50 ug/kg/min via INTRAVENOUS

## 2017-05-03 MED ORDER — PROPOFOL IV BOLUS 10 MG/ML
INTRAVENOUS | Status: DC | PRN
Start: 2017-05-03 — End: 2017-05-03
  Administered 2017-05-03: 100 mg via INTRAVENOUS

## 2017-05-03 MED ORDER — LABETALOL HCL 5 MG/ML IV SOLN
5.0000 mg | INTRAVENOUS | Status: DC | PRN
Start: 2017-05-03 — End: 2017-05-03

## 2017-05-03 MED ORDER — MIDAZOLAM HCL 2 MG/2ML IJ SOLN
INTRAMUSCULAR | Status: DC | PRN
Start: 2017-05-03 — End: 2017-05-03
  Administered 2017-05-03: 2 mg via INTRAVENOUS

## 2017-05-03 MED ORDER — MEPERIDINE HCL 25 MG/ML IJ SOLN
12.5000 mg | INTRAMUSCULAR | Status: DC | PRN
Start: 2017-05-03 — End: 2017-05-03

## 2017-05-03 MED ORDER — GABAPENTIN 300 MG OR CAPS
300.0000 mg | ORAL_CAPSULE | Freq: Once | ORAL | Status: AC
Start: 2017-05-03 — End: 2017-05-03
  Administered 2017-05-03: 300 mg via ORAL
  Filled 2017-05-03: qty 1

## 2017-05-03 MED ORDER — FAMOTIDINE IN NACL 20 MG/50ML IV SOLN
20.0000 mg | Freq: Two times a day (BID) | INTRAVENOUS | Status: DC
Start: 2017-05-03 — End: 2017-05-04

## 2017-05-03 MED ORDER — DEXTROSE 50 % IV SOLN
12.5000 g | INTRAVENOUS | Status: DC | PRN
Start: 2017-05-03 — End: 2017-05-04

## 2017-05-03 MED ORDER — NALOXONE HCL 0.4 MG/ML IJ SOLN
0.1000 mg | INTRAMUSCULAR | Status: DC | PRN
Start: 2017-05-03 — End: 2017-05-03

## 2017-05-03 MED ORDER — OXYCODONE HCL 5 MG OR TABS
5.0000 mg | ORAL_TABLET | ORAL | Status: DC | PRN
Start: 2017-05-03 — End: 2017-05-04

## 2017-05-03 MED ORDER — FENTANYL CITRATE (PF) 100 MCG/2ML IJ SOLN
50.0000 ug | INTRAMUSCULAR | Status: DC | PRN
Start: 2017-05-03 — End: 2017-05-03

## 2017-05-03 MED ORDER — LACTATED RINGERS IV SOLN
INTRAVENOUS | Status: DC
Start: 2017-05-03 — End: 2017-05-04
  Administered 2017-05-03: 15:00:00 via INTRAVENOUS

## 2017-05-03 MED ORDER — HYDRALAZINE HCL 20 MG/ML IJ SOLN
10.0000 mg | INTRAMUSCULAR | Status: DC | PRN
Start: 2017-05-03 — End: 2017-05-03

## 2017-05-03 MED ORDER — LIDOCAINE HCL (CARDIAC) 20 MG/ML IV SOLN
INTRAVENOUS | Status: DC | PRN
Start: 2017-05-03 — End: 2017-05-03
  Administered 2017-05-03: 40 mg via INTRAVENOUS

## 2017-05-03 MED ORDER — GLUCAGON HCL (RDNA) 1 MG IJ SOLR
1.0000 mg | Freq: Once | INTRAMUSCULAR | Status: DC | PRN
Start: 2017-05-03 — End: 2017-05-04

## 2017-05-03 MED ORDER — SENNA 8.6 MG OR TABS
2.0000 | ORAL_TABLET | Freq: Every evening | ORAL | Status: DC
Start: 2017-05-03 — End: 2017-05-04
  Administered 2017-05-03: 17.2 mg via ORAL
  Filled 2017-05-03: qty 2

## 2017-05-03 MED ORDER — CEFAZOLIN SODIUM 1 GM IJ SOLR
INTRAMUSCULAR | Status: DC | PRN
Start: 2017-05-03 — End: 2017-05-03
  Administered 2017-05-03: 1000 mg via INTRAVENOUS

## 2017-05-03 MED ORDER — NALOXONE HCL 0.4 MG/ML IJ SOLN
0.1000 mg | INTRAMUSCULAR | Status: DC | PRN
Start: 2017-05-03 — End: 2017-05-04

## 2017-05-03 MED ORDER — MIDAZOLAM HCL 2 MG/2ML IJ SOLN
INTRAMUSCULAR | Status: DC | PRN
Start: 2017-05-03 — End: 2017-05-03
  Administered 2017-05-03: 1 mg via INTRAVENOUS

## 2017-05-03 MED ORDER — ACETAMINOPHEN 325 MG PO TABS
975.0000 mg | ORAL_TABLET | Freq: Four times a day (QID) | ORAL | Status: DC
Start: 2017-05-03 — End: 2017-05-03

## 2017-05-03 MED ORDER — CELECOXIB 200 MG OR CAPS
200.0000 mg | ORAL_CAPSULE | Freq: Once | ORAL | Status: AC
Start: 2017-05-03 — End: 2017-05-03
  Administered 2017-05-03: 200 mg via ORAL
  Filled 2017-05-03: qty 1

## 2017-05-03 MED ORDER — HYDROMORPHONE HCL 1 MG/ML IJ SOLN
0.5000 mg | INTRAMUSCULAR | Status: DC | PRN
Start: 2017-05-03 — End: 2017-05-03

## 2017-05-03 MED ORDER — ONDANSETRON HCL 4 MG/2ML IV SOLN
4.0000 mg | Freq: Once | INTRAMUSCULAR | Status: DC | PRN
Start: 2017-05-03 — End: 2017-05-03

## 2017-05-03 MED ORDER — ONDANSETRON HCL 4 MG/2ML IV SOLN
INTRAMUSCULAR | Status: DC | PRN
Start: 2017-05-03 — End: 2017-05-03
  Administered 2017-05-03: 4 mg via INTRAVENOUS

## 2017-05-03 MED ORDER — LACTATED RINGERS IV SOLN
INTRAVENOUS | Status: DC
Start: 2017-05-03 — End: 2017-05-03

## 2017-05-03 MED ORDER — LACTATED RINGERS IV SOLN
INTRAVENOUS | Status: DC | PRN
Start: 2017-05-03 — End: 2017-05-03
  Administered 2017-05-03 (×2): via INTRAVENOUS

## 2017-05-03 MED ORDER — INSULIN LISPRO (HUMAN) 100 UNIT/ML SC SOLN (CUSTOM)
1.0000 [IU] | Freq: Four times a day (QID) | INTRAMUSCULAR | Status: DC
Start: 2017-05-03 — End: 2017-05-04
  Administered 2017-05-03: 2 [IU] via SUBCUTANEOUS
  Administered 2017-05-03: 1 [IU] via SUBCUTANEOUS
  Administered 2017-05-04: 2 [IU] via SUBCUTANEOUS
  Filled 2017-05-03 (×2): qty 2
  Filled 2017-05-03: qty 1

## 2017-05-03 MED ORDER — ACETAMINOPHEN 10 MG/ML IV SOLN
INTRAVENOUS | Status: DC | PRN
Start: 2017-05-03 — End: 2017-05-03
  Administered 2017-05-03: 850 mg via INTRAVENOUS

## 2017-05-03 MED ORDER — ACETAMINOPHEN 325 MG PO TABS
975.0000 mg | ORAL_TABLET | Freq: Four times a day (QID) | ORAL | Status: DC
Start: 2017-05-03 — End: 2017-05-04
  Administered 2017-05-03: 975 mg via ORAL
  Filled 2017-05-03: qty 3

## 2017-05-03 MED ORDER — ONDANSETRON HCL 4 MG/2ML IV SOLN
4.0000 mg | Freq: Four times a day (QID) | INTRAMUSCULAR | Status: DC | PRN
Start: 2017-05-03 — End: 2017-05-04

## 2017-05-03 MED ORDER — FENTANYL CITRATE (PF) 250 MCG/5ML IJ SOLN
INTRAMUSCULAR | Status: DC | PRN
Start: 2017-05-03 — End: 2017-05-03
  Administered 2017-05-03 (×2): 50 ug via INTRAVENOUS

## 2017-05-03 MED ORDER — DOCUSATE SODIUM 250 MG OR CAPS
250.0000 mg | ORAL_CAPSULE | Freq: Every day | ORAL | Status: DC
Start: 2017-05-03 — End: 2017-05-04
  Administered 2017-05-04: 250 mg via ORAL
  Filled 2017-05-03 (×2): qty 1

## 2017-05-03 MED ORDER — LACTATED RINGERS IV SOLN
INTRAVENOUS | Status: DC
Start: 2017-05-03 — End: 2017-05-03
  Administered 2017-05-03: 09:00:00 via INTRAVENOUS

## 2017-05-03 MED ORDER — FENTANYL CITRATE (PF) 250 MCG/5ML IJ SOLN
INTRAMUSCULAR | Status: DC | PRN
Start: 2017-05-03 — End: 2017-05-03
  Administered 2017-05-03: 50 ug via INTRAVENOUS

## 2017-05-03 MED ORDER — SODIUM CHLORIDE 0.9 % IV SOLN
12.5000 mg | Freq: Once | INTRAVENOUS | Status: DC | PRN
Start: 2017-05-03 — End: 2017-05-03

## 2017-05-03 MED ORDER — GLUCOSE 4 GM PO CHEW (CUSTOM)
4.0000 | CHEWABLE_TABLET | ORAL | Status: DC | PRN
Start: 2017-05-03 — End: 2017-05-04

## 2017-05-03 SURGICAL SUPPLY — 56 items
APPLICATOR CHLORAPREP 26ML, ~~LOC~~ (Misc Medical Supply)
APPLIER CLIP MEDIUM LIGACLIP (Staplers and staple reloads) ×3
APPLIER LIGACLIP MCA W/30 MEDIUM TI CLIPS (Staplers and staple reloads) IMPLANT
CAUTERY TIP EDGE BLADE ELECTRODE 2.75", INSULATED (Cautery) ×3 IMPLANT
CONTAINER PRECISION SPECIMEN, 4OZ- STERILE (Misc Medical Supply) ×3 IMPLANT
DEVICE FORCE TRIVERSE 14MM ESU (Cautery) ×3 IMPLANT
DEVICE SPEC MAMO SPECBOARD (Misc Medical Supply) IMPLANT
DRAIN HUBLESS ROUND 19FR 1/4 (Lines/Drains) IMPLANT
DRAIN SUCTION EVAC RELIAVAC 100CC (Lines/Drains) ×3
DRAPE HALF SHEET MEDIUM (Drapes/towels) IMPLANT
DRAPE ORTHO U 76" X 120" (Drapes/towels) ×3 IMPLANT
DRAPE POUCH STERI INST 7X11 (Drapes/towels)
DRAPE POUCH STERI INST 7X11 1018 (Drapes/towels) IMPLANT
DRESSING BIOPATCH OD 1" ID 4MM (Dressings/packing) ×3 IMPLANT
DRESSING SPONGE CURITY 4X4 16 PLY (Dressings/packing) IMPLANT
DRESSING TELFA 3" X 4" STRL (Dressings/packing) IMPLANT
GLOVE BIOGEL INDICATOR UNDERGLOVE SIZE 6.5 (Gloves/gowns) ×6 IMPLANT
GLOVE BIOGEL INDICATOR UNDERGLOVE SIZE 7 (Gloves/gowns) ×6 IMPLANT
GLOVE BIOGEL SUPER-SENSITIVE SIZE 6 (Gloves/gowns) ×6 IMPLANT
GLOVE SURGICAL BIOGEL SIZE 6 (Gloves/gowns) ×6 IMPLANT
GLOVE SURGICAL BIOGEL SIZE 6.5 (Gloves/gowns) ×6 IMPLANT
GOWN MICRO COOL LG BLUE, AAMI LVL 4 (Gloves/Gowns) ×6 IMPLANT
GOWN SURGICAL ULTRA XL BLUE, AAMI LVL 3 (Gloves/Gowns) IMPLANT
HEMOSTAT SURGICEL FIBRILLAR 4" X 4" (Hemostatic agents/wax/sealants-absorbable) ×3 IMPLANT
JMC ONLY- O.R. CAMERA COVER LIGHT, STERILE (Misc Surgical Supply) IMPLANT
LIGASURE SMALL JAW 18.8CM - LF1212A (Lap/Endo/Arthroscopy) ×3 IMPLANT
NEEDLE PROTECT 18G X 1.5" (Needles/punch/cannula/biopsy) IMPLANT
NEEDLE PROTECT 25G X 1.5" (Needles/punch/cannula/biopsy) ×3 IMPLANT
PAD GROUND VALLEYLAB REM ADULT E7507 (Misc Surgical Supply) ×3
PEANUT SPONGES BIOSEAL IN HOLDER 5/PACK (Sponges) ×1
PROTECTOR ULNAR NERVE PAD, YELLOW (Misc Medical Supply)
SHEATH GUIDE CIV-FLEX SAVI-SCOUT (Misc Surgical Supply) ×3
SKIN AFFIX 0.4ML HV (Dressings/packing) IMPLANT
SLEEVE SCD KNEE MEDIUM (Misc Medical Supply) ×3 IMPLANT
SOLUTION IRR POUR BTL 0.9% NS 1000ML (Non-Pharmacy Meds/Solutions) ×3 IMPLANT
SOLUTION IRR POUR BTL H20 1000ML (Non-Pharmacy Meds/Solutions) ×3 IMPLANT
SPONGE LAP RF DETECT 18" X 18" XRAY STERILE (Sponges) ×3 IMPLANT
SPONGES BIOSEAL PEANUT IN HOLDER 5/PACK (Sponges) ×3
STAPLER PROXIMATE SKIN 35 WIDE (Staplers and staple reloads) ×3 IMPLANT
STOCKINETTE TUBULAR 4" X 48" STERILE (Dressings/packing) ×3 IMPLANT
STRIP MEDI-STRIP SKIN CLOSURE 1/2 X 4" (Dressings/packing) IMPLANT
STRIP SKIN CLOSURE 1/2 X 4 (Dressings/packing)
SUPPORT MAMMARY LG (External orthotic collars/braces/supports) ×3 IMPLANT
SURGICAL PACK BASIC MAJOR SET-UP (Procedure Packs/kits) ×3 IMPLANT
SUTURE ETHILON 3-0 18" FS-1 (Suture) IMPLANT
SUTURE ETHILON 3-0 18" PS-2 (Suture) ×3 IMPLANT
SUTURE MONOCRYL 4-0 18" PS-2 (Y496) (Suture) IMPLANT
SUTURE MONOCRYL PLUS 4-0 27" PS-2 MCP426 (Suture) ×6 IMPLANT
SUTURE MONOSOF 3-0 18" C-14 (Suture) ×3 IMPLANT
SUTURE PERMA-HAND SILK 2-0 18" FS 685H (Suture) ×3 IMPLANT
SUTURE VICRYL 3-0 18 TIES (Suture) ×3 IMPLANT
SUTURE VICRYL PLUS 2-0 27" SH VCP417 (Suture) ×6 IMPLANT
SUTURE VICRYL PLUS 3-0 27" SH VCP416 (Suture) ×9 IMPLANT
SYRINGE 10CC LL CONTRL (Misc Medical Supply)
SYRINGE HYPO LL 10CC (Needles/punch/cannula/biopsy) ×3
TOWELS OR BLUE 4-PACK STERILE, DISPOSABLE (Drapes/towels) ×6 IMPLANT

## 2017-05-03 NOTE — Interdisciplinary (Signed)
05/03/17 1540   Initial Assessment   CM Initial Assessment Completed   Patient Information   Where was the patient admitted from? Home   Prior to Level of Function Ambulatory/Independent with ADL's   Assistive Device [place];Wheelchair   Prior [employer]) Family   Primary Contact Name, Number and Relationship Mischele Detter, Daughter, [phone]   Permission to [place] Other (Comment)   Sales executive History Not [place] Affiliation No   Discharge Planning   Living Arrangements Spouse /Significant Other   Available Assistance/Support System Children   Type of Residence [place] No   Additional Services Not Applicable   Barriers to Discharge Awaiting clinical improvement   Patient/Family Engaged in Discharge Planning Yes   Patient Has Decision Making Capacity Yes   Patient/Family/Legal/Surrogate Decision Maker Has Been Given Options And Choice In The Selection of Post-Acute Care Providers Not Applicable   Family/Caregiver's Assessed for Readiness, willingness, and ability to provide or support self-management activities;Readiness to provide care to the patient   Respite Care Not Applicable   Public Health Clearance Needed No   Social Worker Consult   Do you need to see a Education officer, museum?  No   Readmission Risk Assessment   Readmission Within 30 Days of Discharge No   Recent Hospitalizations (Within Last 6 Months) No   High Risk For Readmission No     CM met with patient at bedside and confirmed phone number and address on face sheet.    PCP: Dr. Tollie Eth    RX: [facility] [address] [place], [place] [address] Phone: [phone]    DME: Has cane and wheelchair    SNF: No    Home Health: No    Transport home via: Private transportation with daughter Tyrihanna Wingert    DCP: Home with daughter. CM to follow for discharge planning needs.

## 2017-05-03 NOTE — H&P (Signed)
HISTORY & PHYSICAL - INTERVAL ASSESSMENT    **ONLY TO BE USED IN ADDITION TO A HISTORY & PHYSICAL**    [name]  [id number]      [name] is a 67 year old female presents for scheduled surgery today of Rightl ump + ALND     Denies fever, chills, cough, ST, flu-like symptoms, recent infections, CP, SOB.  Allergies morphine - ANAPHYLAXIS  Overall feeling well. No major complaints today. Ready to proceed.     History taken from EMR, edited as needed by me:   [name] is a 67 year old female who presents for evaluation of breast cancer with known axillary disease, unknown primary. She has a mass in the Right breast 9:00, N+4cm that measures 2.1cm which attempted biopsy twice at outside facility with benign/ADH findings. Had change on screening mammogram 10/2016, prior mammo 2014. Workup revealed three masses in Right breast and abnormal lymph node. Breast masses all returned benign on biopsy, except LN shows metastatic disease c/w breast primary. Repeat bx in the breast at [facility] was also benign.  We discussed in these cases of + axillary node with unknown primary there is no difference in survival between mast + ALND vs radn and ALND accoeding to small studies.  She wants BCT so I recommended we remove that area with atypia + ALND.  I discussed the case with Dr. Wetzel Bjornstad and she was in favor of surgery first in order to know the number of positive nodes to help guide decision re; chemo      Current Medical Status:  Unchanged    Medications / Allergies:  Unchanged    Review of Systems:  Unchanged    Physical Examination:  I have examined the patient today.  Unchanged    Laboratory or Clinical Data:  Unchanged    Modifications of Initial Care Plan:  Unchanged        Julianne Handler     05/03/17     8:57 AM  Pager [phone]

## 2017-05-03 NOTE — Brief Op Note (Addendum)
BRIEF OPERATIVE NOTE    DATE: 05/03/2017  TIME: 1:20 PM    PREOPERATIVE DIAGNOSIS: right axillary lymph node positive for breast cancer, right breast mass    POSTOPERATIVE DIAGNOSIS: same    PROCEDURE INFORMATION:  Procedure(s):  RIGHT SAVI LOCALIZED LUMPECTOMY - Wound Class: Class I (Clean) - Incision Closure: Deep and Superficial Layers  RIGHT AXILLARY LYMPH NODE DISSECTION - Wound Class: Class I (Clean) - Incision Closure: Deep and Superficial Layers    ATTENDING SURGEON:   Surgeon(s) and Role:     Remer Macho, MD - Primary    ANESTHESIA: LMA, regional block    FINDINGS:   Right breast tissue with RF and clips in place  Right axillary contents with several enlarged lymph nodes    SPECIMENS:   ID Type Source Tests Collected by Time Destination   A : RIGHT BREAST LUMPECTOMY. SINGLE SUPERIOR. DOUBLE LATERAL.  Lumpectomy Breast, Right PATHOLOGY TISSUE EXAM Remer Macho, MD 05/03/2017 1145    B : Right Axillary Contents Tissue Breast, Right PATHOLOGY TISSUE EXAM Remer Macho, MD 05/03/2017 1201        Fluids/Blood Products:      IV Fluids: 1000cc    Blood Products: none    EBL: 5cc    Urine Output: not recorded    COMPLICATIONS: none    DISPOSITION: PACU

## 2017-05-03 NOTE — Plan of Care (Signed)
Problem: Promotion of Health and Safety  Goal: Promotion of Health and Safety  The patient remains safe, receives appropriate treatment and achieves optimal outcomes (physically, psychosocially, and spiritually) within the limitations of the disease process by discharge.    Information below is the current care plan.  Outcome: Progressing   05/03/17 1800   Patient /Family stated Goal   Patient /Family stated Goal to be comfortable    Adult/Peds Plan of Care   Guidelines Inpatient Nursing Guidelines   Individualized Interventions/Recommendations #1 Fall precautions implemented. Bed in lowest position. Call bell within reach.    Individualized Interventions/Recommendations #2 (if applicable) Patient is spanish speaking but son and daughter are at bedside to translate.    Individualized Interventions/Recommendations #3 (if applicable) Patient started on a clear liquid diet. Has tolerated it thus far.    Individualized Interventions/Recommendations #4 (if applicable) Patient declines desire for pain medication at this time.    Outcome Evaluation (rationale for progressing/not progressing) every shift Patient's VSS. Declines pain medication at this time. Will continue to monitor.

## 2017-05-03 NOTE — Plan of Care (Signed)
Problem: Promotion of Perioperative Health and Safety  Goal: Promotion of Health and Safety of the Perioperative Patient  The patient remains safe, receives treatment appropriate to the surgical intervention and patient's physiological needs and is discharged or transferred to the appropriate level of care.    Information below is the current care plan.   05/03/17 1407 05/03/17 1410   Perioperative Plan of Care   Guidelines --  PACU   Individualized Interventions/Recommendations #1 --  minimal pain    Outcome Evaluation (rationale for progressing/not progessing) every shift --  pt reports minimal pain    Patient /Family stated Goal   Patient /Family stated Goal surgery  --

## 2017-05-03 NOTE — Op Note (Signed)
DATE OF SERVICE:  05/03/2017    PREOPERATIVE DIAGNOSIS:  Right breast cancer.    POSTOPERATIVE DIAGNOSIS:  Right breast cancer.    PROCEDURE PERFORMED:  Right image-guided lumpectomy, axillary node  dissection.     SURGEON/STAFF:  Remer Macho, MD    ASSISTANT:  Sarajane Marek, MD.    ANESTHESIA:  LMA anesthesia.    FLUIDS:  Crystalloid.    ESTIMATED BLOOD LOSS:  50 cc.    DRAINS:  One 19-French Blake drain.    COMPLICATIONS:  None.    INDICATIONS:  Patient presented with a change in mammogram, which  showed axillary mass that was biopsied and came back as a metastatic  lymph node from a breast primary.  She had several biopsies in the  breast which came back as atypia, so essentially an unknown primary,  but since she had atypia we are going to excise that as well.  We  talked about the risks and benefits of surgery including bleeding,  infection, possible cosmetic deformity, possible sensory change,  possible lymphedema, possible reoperation, possible DVT, PE, MI,  arrhythmias.  She wished to proceed.     PROCEDURE IN DETAIL:  Patient was brought to the operating room and  placed in supine position.  General anesthesia was induced.  LMA was  inserted.  She got a gram of Ancef.  She was prepped and draped in  usual sterile fashion.  We called a time-out confirming we were  operating on correct patient, correct procedure.  We made a 4 cm  incision in the mid outer quadrant on the right breast with a knife,  went through subcuticular and subcutaneous tissue with  electrocautery, raised skin flaps superior and inferiorly.  We used a  Nurse, children's to identify the reflector.  We removed the  specimen en bloc.  We sent it to the Bioptics machine, which showed 3  clips in the reflector in the specimen.  Then we packed that and then  turned our attention to the axilla where we made a 5 cm incision  below the hair-bearing area in the right axilla with a knife, went through  subcuticular and subcutaneous tissue  with electrocautery, opened the  axillary fascia.  We dissected along the pectoralis major muscle.  We  put Richardson retractors below it and took soft tissue away from the  chest wall and identified the long thoracic nerve, dissected that  away with a peanut.  With a combination of blunt and sharp  dissection, identified the axillary vein and then we went laterally  and identified the latissimus muscle, went just medial to that,  identified the thoracodorsal bundle.  Then we went from lateral to  medial on the axillary vein, coagulating small vessels and  lymphatics.  We had to take the intercostal brachial nerve due to a  very enlarged, about 4 cm, lymph node that was encasing the  intercostal brachial nerves.  We took this with a LigaSure, but we  kept the long thoracic and thoracodorsal nerves in view at all times  not to injure them.  We skeletonized the thoracodorsal bundle and  medially we saved the median pectoral nerve at level 2.  Then we  irrigated both sites, got hemostasis with electrocautery.  Then we  put a 19-French Blake drain under direct vision in the   axilla, secured it with a nylon.  Then we closed the axilla with 2-0  Vicryl and 3-0 and 4-0 Monocryl for skin.  We closed  the deep breast  tissue with 2-0 Vicryl, 3-0 Monocryl for skin, Dermabond, Steris,  fluffs, and a bra.  The patient tolerated the procedure well.  She  was discharged to recovery in stable condition.  Sponge counts  correct x2.       Job #:  [id number]

## 2017-05-03 NOTE — Anesthesia Procedure Notes (Signed)
Regional Anesthesia Block Procedure Note  Date & Time:     Short Procedural Summary (Full description of each separate nerve block  below):  Procedure #1 -  Thoracic Paravertebral.    Laterality - Right.    Block type - Single injection(s).                          Medications Administrations:      Indications  Procedure performed to address pain in the Chest wall/Breast/Axilla.  Laterality Right  Patient seen and examined in the PreOp area.  This procedure was performed at the request of the referring physician for post operative pain control.         Universal protocol  Universal Protocol: Verbal consent obtained, written consent obtained, risks and benefits discussed, patient states understanding of the procedure being performed, the patient's understanding of the procedure matches consent given, procedure consent matches procedure scheduled, relevant documents present and verified, test results available and properly labeled, site marked, imaging studies available, required blood products, implants, devices, and special equipment available and Immediately prior to procedure a time out was called to verify the correct patient, procedure, equipment, support staff and site/side marked as required  Consent given by: patient  Patient identity confirmed by: verbally with patient, arm band, provided demographic data and hospital-assigned identification number  H&P: H&P Reviewed  Vital signs monitored: Yes  Sterile skin prep: Choloraprep (Chorahexadine)  O2 administered: Face Mask    Patient Instructions        Block Procedure #1  Block procedure type: Thoracic Paravertebral  Procedure laterality: Right  Levels: T 2-3 and T 4-5  Nerve block:  Single injection(s)  Needle gauge to anesthetize skin entry site: 27G  Needle gauge for procedure: 20g and Touhy  Block technique: Ultrasound guided  Ultrasound guidance used to identify relevant anatomy.  Ultrasound guidance used to visualize local anesthetic spread around  nerve(s).  Ultrasound guidance used to guide needle to targeted nerve and avoid vascular puncture.    Brief medication summary  IV sedation as documented on MAR. See MAR for full medication details  Local anesthetic medication: Ropivacaine     Local anesthetic volume: 20 (10 cc each level) mL  Complications: None        Block Procedure #2    Block Procedure #3    Block Procedure #4    Block Procedure #5    Block Procedure #6

## 2017-05-03 NOTE — Interdisciplinary (Signed)
Report to Bridget RN

## 2017-05-03 NOTE — Anesthesia Postprocedure Evaluation (Signed)
Anesthesia Transfer of Care Note    Patient: [name]    Procedures performed: Procedure(s):  RIGHT SAVI LOCALIZED LUMPECTOMY  RIGHT AXILLARY LYMPH NODE DISSECTION    Vital signs: stable           Anesthesia Post Note    Patient: [name]    Procedure(s) Performed: Procedure(s):  RIGHT SAVI LOCALIZED LUMPECTOMY  RIGHT AXILLARY LYMPH NODE DISSECTION      Final anesthesia type: General    Patient location: PACU    Post anesthesia pain: adequate analgesia    Mental status: awake, alert  and oriented    Airway Patent: Yes    Last Vitals:   Vitals:    05/03/17 2018   BP: 149/67   Pulse: 87   Resp: 16   Temp: 36.7 C   SpO2: 98%       Post vital signs: stable    Hydration: adequate    N/V:no    Anesthetic complications: no    Plan of care per primary team.

## 2017-05-04 ENCOUNTER — Telehealth (HOSPITAL_BASED_OUTPATIENT_CLINIC_OR_DEPARTMENT_OTHER): Payer: Self-pay

## 2017-05-04 LAB — HEMOGRAM, BLOOD
Hct: 30.4 % — ABNORMAL LOW (ref 34.0–45.0)
Hgb: 10.5 gm/dL — ABNORMAL LOW (ref 11.2–15.7)
MCH: 30.8 pg (ref 26.0–32.0)
MCHC: 34.5 g/dL (ref 32.0–36.0)
MCV: 89.1 um3 (ref 79.0–95.0)
MPV: 10.6 fL (ref 9.4–12.4)
Plt Count: 248 10*3/uL (ref 140–370)
RBC: 3.41 10*6/uL — ABNORMAL LOW (ref 3.90–5.20)
RDW: 12.2 % (ref 12.0–14.0)
WBC: 14.1 10*3/uL — ABNORMAL HIGH (ref 4.0–10.0)

## 2017-05-04 LAB — GLUCOSE (POCT): Glucose (POCT): 204 mg/dL — ABNORMAL HIGH (ref 70–99)

## 2017-05-04 MED ORDER — OXYCODONE HCL 5 MG OR TABS
5.0000 mg | ORAL_TABLET | ORAL | 0 refills | Status: DC | PRN
Start: 2017-05-04 — End: 2018-02-06

## 2017-05-04 MED ORDER — DOCUSATE SODIUM 250 MG OR CAPS
250.0000 mg | ORAL_CAPSULE | Freq: Every day | ORAL | 0 refills | Status: DC
Start: 2017-05-04 — End: 2018-02-06

## 2017-05-04 MED ORDER — GABAPENTIN 300 MG OR CAPS
300.0000 mg | ORAL_CAPSULE | Freq: Three times a day (TID) | ORAL | 0 refills | Status: DC
Start: 2017-05-04 — End: 2018-02-06

## 2017-05-04 MED FILL — GABAPENTIN CAP 300 MG: MG | 14 days supply | Qty: 42 | Fill #0

## 2017-05-04 MED FILL — OXYCODONE HCL TAB 5 MG: MG | 2 days supply | Qty: 12 | Fill #0

## 2017-05-04 MED FILL — DOCUSATE CALCIUM CAP 250 MG: MG | 20 days supply | Qty: 20 | Fill #0

## 2017-05-04 NOTE — Interdisciplinary (Signed)
discharged instruction given to pt and  Daughter with  Use of interpreter  ID  # [phone] , instruction given  regarding diet ,medication , activity , follow up appointment and what complications to watch for  And  JP  Drain , daughter return demonstrate sterile technique of  and teach back  .

## 2017-05-04 NOTE — Addendum Note (Signed)
Addendum  created 05/04/17 1117 by Cloria Spring, MD    Sign clinical note

## 2017-05-04 NOTE — Plan of Care (Signed)
Problem: Promotion of Health and Safety  Goal: Promotion of Health and Safety  The patient remains safe, receives appropriate treatment and achieves optimal outcomes (physically, psychosocially, and spiritually) within the limitations of the disease process by discharge.    Information below is the current care plan.  Outcome: Progressing   05/03/17 2036 05/04/17 0303   Patient /Family stated Goal   Patient /Family stated Goal To get better --    Adult/Peds Plan of Care   Guidelines --  Inpatient Nursing Guidelines   Individualized Interventions/Recommendations #1 --  Assess and monitor's level of pain and treat accordingly   Individualized Interventions/Recommendations #2 (if applicable) --  Assess pt.'s toleration of clear liquid diet.    Individualized Interventions/Recommendations #3 (if applicable) --  Moniotr pt.'s blood sugar, and provide insulin coverage per sliding scale.    Outcome Evaluation (rationale for progressing/not progressing) every shift --  Pt. currently in stable condition. Pt. denies any pain or discomfort. Educated pt. and family that PRN pain medication is available if needed. Pt. witho order to start on a Regular diet for breakfast on 05/04/17. Continue to monitor.

## 2017-05-04 NOTE — Discharge Summary (Signed)
Patient Name:  [name]    Principal Diagnosis (required):  Breast cancer, right (CMS-HCC)      Hospital Problem List (required):  Active Hospital Problems    Diagnosis    *Breast cancer, right (CMS-HCC) [facility]   No resolved problems to display.       Additional Hospital Diagnoses ("rule out" or "suspected" diagnoses, etc.):  None    Principal Procedure During This Hospitalization (required):  RIGHT SAVI LOCALIZED LUMPECTOMY - Wound Class: Class I (Clean) - Incision Closure: Deep and Superficial Layers    Other Procedures Performed During This Hospitalization (required):  None    Procedure results are available in Chart Review in Epic.  For those providers external to [facility], the key procedure results are listed below:  Right breast tissue with RF and clips in place  Right axillary contents with several enlarged lymph nodes    Consultations Obtained During This Hospitalization:  Occupational Therapy      Reason for Admission to the Hospital / History of Present Illness:  [name] is a62 year oldfemale who presents for evaluation of breast cancer with known axillary disease, unknown primary. She has a mass in the Right breast 9:00, N+4cm that measures 2.1cm which attempted biopsy twice at outside facility with benign/ADH findings. Had change on screening mammogram 10/2016, prior mammo 2014. Workup revealed three masses in Right breast and abnormal lymph node. Breast masses all returned benign on biopsy, except LN shows metastatic disease c/w breast primary. Repeat bx in the breast at [facility] was also benign. We discussed in these cases of + axillary node with unknown primary there is no difference in survival between mast + ALND vs radn and ALND accoeding to small studies. She wants BCT so I recommended we remove that area with atypia + ALND. I discussed the case with Dr. Wetzel Bjornstad and she was in favor of surgery first in order to know the number of positive nodes to help guide  decision re: chemo.    Hospital Course by Problem (required):  On 05/03/17, the patient underwent the above operative by Dr. Benjie Karvonen. Please see operative report for details. Perioperative antibiotic prophylaxis was provided. She tolerated the surgery well and admitted to the surgical service for recovery. Mechanical DVT prophylaxis was provided.     Her post operative course was unremarkable. On POD1 she worked with occupational therapy.    On day of discharge, the patient was ambulating, tolerating a low-fat diet, voiding spontaneously, had return of bowel function, and pain was controlled on oral medications. She will follow up with Dr. Benjie Karvonen  in 1-2 weeks.     Tests Outstanding at Discharge Requiring Follow Up:  None    Discharge Condition (required):  Stable.    Key Physical Exam Findings at Discharge:  Gen: NAD, resting comfortably in bed  Chest: Surgical bra in place without strikethrough, right JP drain with SS output  Neuro: a&ox3, motor/sensory intact  Lungs: Normal work of breathing on room air  Abd: Soft, non tender, non distended  Ext: wwp, no peripheral edema    Discharge Diet:  Regular.    Discharge Medications:     What To Do With Your Medications      START taking these medications      Add'l Info   docusate sodium 250 MG capsule  Commonly known as:  COLACE  Take 1 capsule (250 mg) by mouth daily.   Quantity:  20 capsule  Refills:  0     gabapentin 300 MG capsule  Commonly known as:  NEURONTIN  Take 1 capsule (300 mg) by mouth 3 times daily.   Quantity:  42 each  Refills:  0     oxyCODONE 5 MG immediate release tablet  Commonly known as:  ROXICODONE  Take 1 tablet (5 mg) by mouth every 4 hours as needed for Severe Pain (Pain Score 7-10).   Quantity:  12 tablet  Refills:  0        CONTINUE taking these medications      Add'l Info   aspirin 81 MG tablet  Take 81 mg by mouth daily.   Refills:  0     glimepiride 4 MG tablet  Commonly known as:  AMARYL  Take 4 mg by mouth every morning.   Refills:  0        losartan-hydrochlorothiazide 100-25 MG tablet  Commonly known as:  HYZAAR  Take 1 tablet by mouth daily.   Refills:  0     metFORMIN 1000 MG tablet  Commonly known as:  GLUCOPHAGE  Take 1,000 mg by mouth 2 times daily (with meals).   Refills:  0     Omega-3 Fish Oil/Vitamin D3 1000-1000 MG-UNIT Caps   Refills:  0     omeprazole 40 MG capsule  Commonly known as:  PRILOSEC  Take 40 mg by mouth daily.   Refills:  0     ondansetron 4 MG tablet  Commonly known as:  ZOFRAN  Take 4 mg by mouth every 8 hours as needed for Nausea/Vomiting.   Refills:  0     REFRESH OPTIVE ADVANCED 0.5-1-0.5 % Soln  Generic drug:  Carboxymeth-Glycerin-Polysorb   Refills:  0     simvastatin 10 MG tablet  Commonly known as:  ZOCOR  Take 10 mg by mouth every evening.   Refills:  0     VISION FORMULA 2 PO   Refills:  0           Where to Get Your Medications      These medications were sent to [facility]  [id number], [place] [address]    Hours:  Mon-Fri 8:30am-7:00pm, Sat-Sun 9am-5:00pm Phone:  [phone]    docusate sodium 250 MG capsule   gabapentin 300 MG capsule   oxyCODONE 5 MG immediate release tablet         Allergies:  Allergies   Allergen Reactions    Morphine Anaphylaxis and Nausea and Vomiting    Phenylephrine Anaphylaxis and Nausea and Vomiting    Benadryl [Diphenhydramine] Nausea Only       Discharge Disposition:  Home.    Discharge Code Status:  Full code / full care  This code status is not changed from the time of admission.    Follow Up Appointments:    Scheduled appointments:  Future Appointments   Date Time Provider [place]   05/12/2017  1:20 PM Remer Macho, MD [facility]       For appointments requested for after discharge that have not yet been scheduled, refer to the [facility] Discharge Referrals section of the After Visit Summary.    Discharging 89 Contact Information:  [facility] operator at [phone].

## 2017-05-04 NOTE — Anesthesia Follow Up (Signed)
Regional Anesthesia Telephone Encounter Follow-Up Note  Peripheral Nerve Block    History of Present Illness:     [name] is a 67 year old female who is POD 1 status post R localized lumpectomy, R axillary LND. The patient had a right paravertebral peripheral nerve block placed for post-operative pain/intraoperative anesthesia.     Patient reports:  Nerve block site is clean and dry without erythema, induration or tenderness to palpation  Sensory block: unable to assess  Motor block: unable to assess    Assessment:  This is a 67 year old female  who had a regional nerve block.  Unable to reach patient today.     Plan:   Left voicemail with patient's daughter (which is the number listed on file). Will try again tomorrow to check for block resolution.   Patient advised to call [phone] and page the regional anesthesia fellow/resident on-call with any questions or concerns.

## 2017-05-04 NOTE — Interdisciplinary (Signed)
Occupational Therapy Evaluation and Discharge    Current Functional Status  [place] AM-PAC: Daily Activity  Assistance Needed to Put on and Take off Regular Lower Body Clothing: A little (Supervised/Min Assist)  Assistance Needed to Bathe, Including Washing, Rinsing, and Drying: A little (Supervised/Min Assist)  Assistance Needed to Toilet [employer], Bedpan, or Urinal): None (Independent)  Assistance Needed to Put on and Take off Regular Upper Body Clothing: None (Independent)  Assistance Needed to Take Care of Personal Grooming Such as Brushing Teeth: None (Independent)  Assistance Needed to Eat Meals: None (Independent)  AM-PAC Daily Activity Total Score: 22  Assessment: AM-PAC Daily Activity - Current Functional Impairment Level:  CJ: 20-39% impaired    Post Acute Discharge Considerations  Anticipated Required Level of Cognitive Assistance: None  Anticipated Required Level of Safety Assistance: Distant supervision  Anticipated Required Level of Self Care Assistance: None  Anticipated Physical Barriers Impacting Discharge: None  Anticipated Level of Therapy Tolerance and Ongoing Therapy Needs: Outpatient  Equipment Recommendations: None- no equipment needed    Referring Physician: Remer Macho         Inpatient Diagnosis    Problem List       Codes    Post-operative pain    -  Primary ICD-10-CM: G89.18  ICD-9-CM: 338.18    Relevant Medications    oxyCODONE (ROXICODONE) 5 MG immediate release tablet    Constipation due to pain medication     ICD-10-CM: K59.03  ICD-9-CM: 564.09, E947.9    Relevant Medications    docusate sodium (COLACE) 250 MG capsule    Decreased activities of daily living (ADL)     ICD-10-CM: R68.89  ICD-9-CM: 780.99        Start of Care: 05/04/17  Onset/Admission Date: 05/03/17  Reason for Referral: Fall risk management;Difficulty with activities of daily living;Range of motion limitation;Decline in functional mobility  Preferred Language:Spanish       Patient History   Medical  History  History of presenting condition: 67 year old female who presents for evaluation of breast cancer with known axillary disease, unknown primary, now POD #1 s/p RIGHT SAVI LOCALIZED LUMPECTOMY, RIGHT AXILLARY LYMPH NODE DISSECTION  Date of Surgery/Procedure: 05/03/17  Previous treatment for condition: Surgery  Mechanism of Injury: Newly diagnosed oncological condition  Medications affecting therapy : Analgesics  Fall History: Reported fall in the last 6 months, no injury sustained(Per daughter pt tripped on furniture once but that was only time pt fell)  Reported fall in the last 6 months, no injury sustained: Fall risk assessment/testing preformed  Past Medical History:   Diagnosis Date    Diabetes mellitus (CMS-HCC)     Hypertension     Migraine      Past Surgical History:   Procedure Laterality Date    BREAST LUMPECTOMY  1996    benign mass    CESAREAN SECTION, CLASSIC      TOTAL ABDOMINAL HYSTERECTOMY W/ BILATERAL SALPINGOOPHORECTOMY  1999    d/t benign tumor        Functional History  Prior Level of Function: No deficits  Communication : No communication impairment  Employment Status: Employed  Employment Type: IT sales professional  Work modifications: Off work  Teacher, music of Daily Living : Independent with IADLs  Functional Mobility Assistance Needs: None-Independent with mobility  General ADL/Self-Care Assistance Needs: None- Independent with ADLs and self care    Social History  Living Situation: Lives with parent/family(Pt lives with ehr daughter who can provide constnt SUP/assist. Pts son also visits  regularly to provide additional assist if needed)  Home Environment: Single story home  Home accessibility : Accessible with wheelchair or walker  Bathroom Accessibility: Standard height toilet;Tub/shower combo  Leisure Activity/Participation: Enjoys being with her 3 dogs        Subjective  Subjective Information: Pt feeling well, eager to go home today and agreeable to OT     Pain Assessment      Most Recent Value   Pain Rating   Patient status  Patient agreeable to treatment, Nursing in agreement for treatment   Patient premedicated prior to treatment  Yes   Numeric Pain Rating Scale    Pain Intensity - rating at present  0   Pain Intensity - rating at worst   3   Pain Intensity- rating after treatment  0   Frequency   Intermittent   Description   Aching, Sharp   Pain Description  R axillary region        Inpatient Patient Safety Considerations  Patient Call Light Within Reach: Yes  Patient Left Sitting: At the edge of a bed  Patient Requires for Safe Ambulation: Other (comment)(N/A)  The Patient May Be at Risk for Falls: Yes, notified nursing  Family/Caregiver Present for Treatment: Child/children  Activity Restrictions   Activity Restrictions: Lifting restrictions(No lifting >10 lbs)  Inpatient Precautions/Contraindications  Precautions: Fall  Fall: Socks/charm  Equipment/Lines Currently Present at Bedside: Lines/drains/airways  Lines/Drains/Airways: IV;Drain  Patient premedicated prior to treatment: Yes    Objective     OT Functional     Row Name 05/04/17 1000       Inpatient Precautions/Contraindications    Precautions  Fall    Fall  Socks/charm    Equipment/Lines Currently Present at Bedside  Lines/drains/airways    Lines/Drains/Airways  IV;Drain    Row Name 05/04/17 1000       Activities of Daily Living (ADLs)    Patient Participation in ADL Tasks  Patient agreeable to participate in ADL tasks    Self Feeding  Independent    Upper Extremity Dressing  Independent    Lower [place]    Toilet Transfer  [place] Name 05/04/17 1000       [place] AM-PAC: Daily Activity    Assistance Needed to Put on and Take off Regular Lower Body Clothing  3    Assistance Needed to Bathe, Including Washing, Rinsing, and Drying  3    Assistance Needed to Toilet Environmental manager, Bedpan, or Urinal)  4    Assistance Needed to Put on and Take off Regular  Upper Body Clothing  4    Assistance Needed to [place] Such as Brushing Teeth  4    Assistance Needed to Eat Meals  4    AM-PAC Daily Activity Total Score  22    Assessment: AM-PAC Daily Activity - Current Functional Impairment Level  Score 20-22    Row Name 05/04/17 1000       Activity Tolerance    General Activity Tolerance  Good - tolerates 75-100% of treatment session    [place] Name 05/04/17 1000       Cognition Assessment    Cognition Restrictions  No cognitive deficits    [place] Name 05/04/17 1000       Communication Assessment    Communication Restrictions  No communication limitations or impairments noted. Current status of hearing, speech and vision allow functional communication.  [place] Name 05/04/17 1000       Coordination/Motor Control Assessment    Coordination/Motor Control Restrictions  No limitations or impairments noted. Movement patterns are fluid and coordinated throughout    [place] Name 05/04/17 1000       Extremity Assessment - General    Extremity Restrictions  Flexibility, strength and tone grossly within functional limits throughout    [place] Name 05/04/17 1000       Mobility for Daily Activities    Patient Participation in Mobilization Activities  Patient agreeable to participate in mobility/or out of bed activities    Supine to [place] to [place] level    Assistance Level  Independent    Stand to [place] to/from [place] Name 05/04/17 1000       Perception Assessment    Perception Restrictions  No perception limitations or impairments noted    [place] Name 05/04/17 1000       Postural Control Assessment    Postural Control Restrictions  No postural control limitations or impairments noted. Patient is able to maintain postural control during mobility skills and daily activities.    [place] Name 05/04/17  1000       Balance for Daily Activities    Balance Restrictions  No balance limitations or impairments noted. Patient is able to maintain balance during mobility skills and daily activities.    [place] Name 05/04/17 1000       Sensation - General    Sensation - General  Intact - no limitations or impairments noted throughout          Assessment: Pt is 67 year old female who presents for evaluation of breast cancer with known axillary disease, unknown primary, now POD #1 s/p RIGHT SAVI LOCALIZED LUMPECTOMY, RIGHT AXILLARY LYMPH NODE DISSECTION. Pt presents as overall SUP-IND for ADL's and functional mobility. Pt presents with good dynamic sitting and standing balance and good activity tolerance. Pt presents with RUE shoulder flex 0-130, and shoulder abduction 0-130. Pt educated on role of OT, home/ADL safety, POC, Phase 1 BUE HEP, general post op precautions, and lymphedema precautions. Pt verbalized understanding and returned demo with SUP for BUE HEP x10. Pt currently functioning at/near baseline with no further skilled IPOT needs at this time. Pt lives in 1 level home with daughter who can provide constant SUP/assist. Anticipate safe d/c home when medically stable. Thank you for this consult. Recommend follow up with OPOT for Phase 2 BUE HEP and preliminary lymphedema evaluation.     Plan  The plan of care was developed in conjunction with the patients goals. It was reviewed with the patient, including a review of the physical findings, proposed treatment, frequency and duration of treatment sessions, precautions, limitations and expected outcomes. The patient acknowledged understanding of all of the above and agreed to the treatment plan as stated.    Functional Limitation Reporting       Functional Assessment Tool  Assessment: AM-PAC Daily Activity - Current Functional Impairment Level:  CJ: 20-39% impaired      Inpatient Treatment Plan  Frequency of treatment : One time only, further treatment not  indicated  Duration of Treatment: One time only, further treatment not indicated  Status of Treatment: Patient appropriate for discharge from  therapy  Treatment Plan Discussion  Include in My Healthcare: Myself;My family;My healthcare team  Treatment Plan Discussion & Agreement: Patient;Family;Patient stated understanding and agreement with the therapy plan  Patient/Family Questions: Yes - All questions asked & answered  Patient/Family Education  Learner(s): Patient;Family  Patient/Family Training in Appropriate Therapeutic Interventions: Completed this visit;Verbalized understanding;Able to return demonstrate teaching  Education Topic(s): Activity restrictions and precautions;ADL/Self Care;Home/Community Safety;Exercise Program;Lymphedema Management  Occupational Therapy Patient Discharge Instructions  Your Occupation Therapist suggests the following: Continue to complete your home exercise program daily as instructed;Continue to follow your prescribed mobility precautions when transferring to the chair and toilet as instructed;Continue to complete your self care Activities of Daily Living as frequently as possible  Supervision is suggested for : Bathe;Dress  Therapy Plan Communication  Therapy Plan Communication: Discussed therapy plan with Nursing    Treatment Today   Type of Eval  Low Complexity [phone]): Completed  Therapeutic Procedures  Therapeutic Exercise (97110): Range of motion exercises;"Muscle Pumping" Home Exercise Program;Home Exercise Program (HEP) demonstration and performance;Patient education;Other (comment)(edu on lymphedema and general post op precautions)  Location: Upper extremity      Total TIMED Treatment (min): 30  Self-Care/ADL Training [phone]): Self-care activities of dally living;Safety procedures;Personal hygiene;Patient education;Dressing;Compensatory training      Total TIMED Treatment (min): 15     Treatment Time  Treatment Start Time: 0940  Total TIMED Treatment (min): 45  Total  Treatment Time (min): 60

## 2017-05-04 NOTE — Discharge Instructions (Signed)
POST OPERATIVE INSTRUCTIONS   - You may go home wearing a Post-Surgical Bra.  - You may shower 72 hours (3 days) after your surgery.    - Prior to showering you may remove your post-surgical vest and any gauze. Place any gauze in the trash. Wash surgical area with soap and water and pat dry.  - Do NOT replace the gauze or place any dressing to the surgical site after your shower.   - You do not need to continue wearing the Post-Surgical Vest after 72 hours.   - For added support you may wear a sports bra that is not too tight or you may wear a camisole that has a built in bra that is supportive and not too tight fitting. You do not need to wear the post-surgical vest after 72 hours (3 days).  - No heavy lifting. No lifting anything greater than 10 pounds (example: gallon of milk) for 4 weeks.   - You may have Steri-Strips at the surgical incision. If you have Steri-Strips on the surgical incision, do not remove and wait until you see your doctor for your follow up visit.   - No swimming until 30 days from your surgery date.  - You will be scheduled for a follow up visit with your doctor approximately 10 days post-operatively to follow up on how you are feeling after your surgery, follow up on how your incision is healing, and follow up on any other support needed.   - Your pathology from your surgery will be discussed with you at your follow up visit with your doctor.         *Please note that it takes the Pathology Department approximately 7-10 business days to provide a final pathology report*    JP Drain Care:   Empty JP drains 2-3 times/day and as needed.   Record drain output below.     JP Drain #1                                                Any Questions and/or Symptoms please call your Nurse Case Manager:   Lorra Hals, MPH, RN, OCN  @  [phone] or [phone]  CALL YOUR DOCTOR IF YOU EXPERIENCE ANY OF THE FOLLOWING SYMPTOMS:   - Fever higher than 100.20F (oral temperature)   - Redness of the skin at  incision site    - Drainage    - Increased tenderness at the surgical site   - Cloudy, foul-smelling drainage   For any symptoms that occur during the weekend or after 5pm Monday through Friday please call the [facility] Operator @ [phone] and inform the operator that your surgeon, Dr. Tenna Delaine needs to be contacted for the post-surgical symptoms that you are experiencing.        Mastectoma, cuidados posteriores  Lumpectomy, Care After  Lea esta informacin sobre cmo cuidarse despus del procedimiento. Su mdico tambin podr darle instrucciones ms especficas. Comunquese con su mdico si tiene problemas o preguntas.  Qu puedo esperar despus del procedimiento?  Despus del procedimiento, es comn tener los siguientes sntomas:   [employer].   Inflamacin mamaria.   Rigidez en el brazo o el hombro.   Cambio en la forma y la sensibilidad de las [place].   Tejido cicatricial que se siente duro al tacto en la zona  donde se extrajo el ndulo.    Siga estas instrucciones en su casa:  El bao   Tome baos con esponja hasta que el mdico la autorice a ducharse o a Teacher, early years/pre de inmersin.   No tome baos de inmersin, no nade ni use el jacuzzi hasta que el mdico lo autorice.  Cuidados de la incisin   Siga las indicaciones del mdico acerca del cuidado de la incisin. Haga lo siguiente:  ? Lvese las manos con agua y jabn antes de Quarry manager las vendas (vendaje). Use desinfectante para manos si no dispone de [place].  ? Cambie el vendaje como se lo haya indicado el mdico.  ? No retire los puntos (suturas), la goma para cerrar la piel o las tiras [place]. Es posible que estos deban quedar puestos en la piel durante 2semanas o ms tiempo. Si los bordes de las tiras adhesivas empiezan a despegarse y Therapist, sports, puede recortar los que estn sueltos. No retire las tiras [facility] por completo a menos que el mdico se lo indique.   [place] zona de la incisin para  detectar signos de infeccin. Est atento a los siguientes signos:  ? Aumento del enrojecimiento, de la hinchazn o del dolor.  ? Ms lquido Delorise Shiner.  ? Calor.  ? Pus o mal olor.   Mantenga el vendaje limpio y seco.      Si la enviaron de regreso a su casa con un drenaje quirrgico colocado, siga las instrucciones del mdico sobre cmo vaciarlo.  Actividad   Retome sus actividades normales como se lo haya indicado el mdico. Pregntele al mdico qu actividades son seguras para usted.   Evite las actividades que requieren mucha energa (extenuantes).   Evite cualquier actividad que pueda causarle una lesin en el brazo que est del lado de la [place].   No levante ningn objeto que pese ms de 10libras (4,5kg). Evite levantar objetos con el brazo que est del lado de la [place].   No cargue objetos pesados [employer].   Despus de que le saquen el drenaje, debe realizar ejercicios para evitar que el brazo se entumezca e hinche. Consulte al [employer] tipos de ejercicios que son seguros para usted.  Instrucciones generales   [employer] de venta libre y los recetados solamente como se lo haya indicado el mdico.   Puede seguir su dieta habitual.   Use un sostn de soporte como se lo haya indicado su mdico.   Cuando est sentada o acostada, levante (eleve) el brazo por encima del nivel del corazn.   No use anillos, pulseras ni otros accesorios ajustados en el brazo, la [place] o los dedos del lado de la [place].  Controles   Concurra a todas las visitas de control como se lo haya indicado el mdico. Esto es importante.   Es posible que deba examinarse para Hydrographic surveyor lquido extra alrededor de los ganglios linfticos (linfoedema). Siga las indicaciones del mdico sobre la frecuencia en que debe realizarse los controles.   Si le han extrado algn ganglio linftico durante el procedimiento, asegrese de darles toda la informacin a sus mdicos. Esta es una informacin importante  que debe proporcionar antes de ciertos procedimientos, como una extraccin de sangre para donar o la medicin de la presin arterial.  Comunquese con un mdico si:   Le aparece una erupcin cutnea.   Tiene fiebre.   El medicamento para Glass blower/designer no le hace efecto.   La  hinchazn, la debilidad o el entumecimiento del brazo no han mejorado despus de unas semanas.   Tiene una hinchazn nueva en la mama o el brazo.   Tiene ms enrojecimiento, hinchazn o dolor en la zona de la incisin.   Le sale ms lquido o sangre de la incisin.   La incisin est caliente al tacto.   Tiene pus o percibe que sale mal olor del lugar de la incisin.  Solicite ayuda de inmediato si:   Tiene un dolor muy intenso en la mama o el brazo.   Siente dolor en el pecho.   Tiene dificultad para respirar.  Esta informacin no tiene Marine scientist el consejo del mdico. Asegrese de hacerle al mdico cualquier pregunta que tenga.

## 2017-05-04 NOTE — Telephone Encounter (Addendum)
Post op phone call pt assessment/pt education S/P Right lumpectomy + ALND on 05/03/2017    Language line Spanish interpreter ID [phone] used to translate call.    Called patient for post op assessment - no answer.     LVM asking for pt. To call NCM back at her soonest convenience and reminded her of her post-op follow up on 05/12/2017 @ 1320. NCM number provided.

## 2017-05-05 LAB — MRSA SURVEILLANCE CULTURE

## 2017-05-05 NOTE — Addendum Note (Signed)
Addendum  created 05/05/17 1025 by Cloria Spring, MD    Sign clinical note

## 2017-05-05 NOTE — Anesthesia Follow Up (Signed)
Regional Anesthesia Telephone Encounter Follow-Up Note  Peripheral Nerve Block    History of Present Illness:     [name] is a 67 year old female who is POD 2 status post R localized lumpectomy, R axillary LND. The patient had a right paravertebral peripheral nerve block placed for post-operative pain/intraoperative anesthesia.     Patient reports:  Nerve block site is clean and dry without erythema, induration or tenderness to palpation  Sensory block: absent  Motor block: absent    Assessment:  This is a 67 year old female  who had a regional nerve block.  Block resolved.     Plan:   No further follow up necessary.   Patient advised to call [phone] and page the regional anesthesia fellow/resident on-call with any questions or concerns.

## 2017-05-06 NOTE — Telephone Encounter (Signed)
Attempted to call patient for a post op phone call. Patient is S/P 05/03/17 Right Lumpectomy & ALND    Language line Spanish interpreter ID [phone] used to translate call.    Unable to reach patient. LVM asking for patient to return our call at [phone].

## 2017-05-09 ENCOUNTER — Other Ambulatory Visit: Payer: Self-pay

## 2017-05-12 ENCOUNTER — Ambulatory Visit: Payer: Medicare Other | Attending: Surgical Oncology | Admitting: Surgical Oncology

## 2017-05-12 VITALS — BP 141/65 | HR 91 | Temp 98.4°F | Resp 16 | Ht <= 58 in | Wt 121.9 lb

## 2017-05-12 DIAGNOSIS — Z09 Encounter for follow-up examination after completed treatment for conditions other than malignant neoplasm: Secondary | ICD-10-CM

## 2017-05-12 DIAGNOSIS — C50919 Malignant neoplasm of unspecified site of unspecified female breast: Principal | ICD-10-CM | POA: Insufficient documentation

## 2017-05-12 LAB — FISH: HER-2NEU AMPLIFICATION ASSAY

## 2017-05-12 NOTE — Patient Instructions (Signed)
[facility]  PHONE LIST FOR PATIENTS    Hours of operation:  Monday-Friday 8:00 - 5:00pm. Closed Holidays and Weekends    AFTER HOURS EMERGENCY NUMBER:    For urgent questions after hours, or on weekends/holidays, contact the [facility] paging operator at [phone], and ask to speak to the doctor on call for Dr. Sarah Blair    To make a follow up appointment, please call the call the [facility] center at: [phone]    For scheduling issues, insurance or disability questions, please call,  Administrative Assistant Lori Morgan:  Phone: [phone], Fax: [phone]     For nursing questions during weekdays, please call:   Amanda Sawyer, RN, BSN, Nurse Case Manager at [phone]  *Amanda works Monday - Thursday but there is always a covering nurse covering my phone on Friday's that you can still call     For Medication Refills, allow 3-4 days and fax request to [phone]   Attention: Amanda Sawyer, RN    Social Worker: Laurie Knight, BS, MSW, LCSW   Phone: [phone]    Breast Cancer Support Group Schedule 2019  Each 3rd Wednesday of the month (Please note no group in Feb or Dec 2019)   2-3PM   [facility] (Follow the signs)   Group involves gentle movement, best to wear comfy clothes.   RSVP appreciated by calling facilitator Laurie Knight, LCSW [phone]      Information Desk [phone]   ** General information: directions, telephone numbers, or available services.       [facility] number: [phone]    [facility]: [phone]   **Call to schedule imaging appointments     Administrative Assistant (Medical Oncology):  Kathleen Caldwell                                           Phone:  [phone]  Fax:  [phone]     Breast Cancer Patient Navigator:  Cecilia Kasperick, RN, MSN          Phone: [phone]     Family Cancer  Genetics : [phone]          Occupational Therapy : [phone]     Clinical Trials Office [phone]     [facility] [id number] , [place], [place] [address]      Insurance Contracting: [phone]   MED CENTER Billing: [phone]   [facility] (Physician) Billing: [phone]   Marisela, [facility] Financial Counselor: [phone] [email or web address]     For [facility] support services [email or web address]     For other support from the American Cancer Society, please visit: http://[email or web address].org/Treatment/SupportProgramsServices/index     Patient and Family Resource Center   Open daily; hours vary   Location: [facility], Room 1066, on the first floor just to the left of the lobby elevators   Phone [phone]

## 2017-05-12 NOTE — Interdisciplinary (Addendum)
Post op appointment with Dr. Benjie Karvonen   S/P Right lumpectomy with right ALND   Exam done and records reviewed.   Ambulatory. 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 Benjie Karvonen  Pathology result reviewed with the patient by Dr. Benjie Karvonen    Wellbeing Assessment :  Patient accompanied by daughter who is translating appt. [place] interpreting services offered and refused.   Transportation issue: None  Barriers to learning: None.  Emotional support given     Interventions:  Supportive care given. Personal difficulties related to treatment addressed to patient's satisfaction. Patient/family concerns addressed.     Education:  Contact information, AVS given and reviewed. Questions asked and answered to patient's satisfaction and understanding    Plan:  -OT referral placed  -DME order placed. NCM to follow up once authorized  -F/U in 1 month

## 2017-05-12 NOTE — Progress Notes (Signed)
Surgical Oncology:  Demographics:  Date: May 12, 2017  Patient Name: [name]  Medical Record #: [id number]  DOB: 03/06/1951   Age: 66 year old     Sex: female    Referring Physician: Vince Soun, MD     Interpreter id [id number]    Reason for visit: f/u     History of Present Illness:   Cozette Staib is a 66 year old female with stage 2 IDC.  She presented with axillary mets.  Unable to find primary tumor in breast. . She has a mass in the Right breast 9:00, N+4cm that measures 2.1cm which attempted biopsy twice at outside facility with benign/ADH findings. Had change on screening mammogram 10/2016, prior mammo 2014. Workup revealed three masses in Right breast and abnormal lymph node. Breast masses all returned benign on biopsy, except LN shows metastatic disease c/w breast primary. Repeat bx in the breast at [place] was also benign.  We discussed in these cases of + axillary node with unknown primary there is no difference in survival between mast + ALND vs radn and ALND accoeding to small studies.  She wants BCT so I recommended we remove that area with atypia + ALND.  She had a lump + ALND for IDC T0N1 3/19 nodes    ROS: Denies fevers, chills, unintentional wt loss, new HA, vision change, CP, cough, SOB, N/V, hematuria, dysuria, new masses/lumps, nipple d/c, extremity swelling, bone pain.      A: Right breast, lumpectomy:  -Mammary tissue with rare focus of atypical ductal hyperplasia in the  background of proliferative fibrocystic changes and microcalcification.  -No DCIS or invasive carcinoma identified.  -Multiple biopsy site changes.  -Pathologic stage: pTXN1a.  B: Right axillary contents, excision:  -Metastatic carcinoma in three of three of nineteen lymph nodes (3/19).  -The largest size of metastatic carcinoma measures 4.3 cm with extranodal  extension.  COMMENT:  BREAST CANCER: SYNOPTIC REPORT:  Specimen/Procedure:  Excision (less than total mastectomy)  Lymph Node Sampling:  Axillary dissection  Specimen  Laterality:   Right  Tumor Focality: Not applicable  Invasive Tumor Type: Invasive tumor not identified  Duct Carcinoma In Situ (DCIS): Not identified, rare focus of ADH is seen  close to one of the biopsy sites  Lymph Nodes Positive /Total Lymph Nodes Sampled:  3/19  Number of lymph nodes with macrometastases: 3  Number of lymph nodes with micrometastases: 0  Diameter of largest metastasis: 4.3 cm  Extranodal extension and size: present, 1.0 cm  Treatment Effect(s): Response to Presurgical (Neoadjuvant) Therapy:  Not  applicable.  Her2/neu Status (IHC) : Negative ( score 0, ASCO/CAP 2018)  Her2/neu Status (FISH): Pending on current specimen  Hormone Receptor Status (ER/PR):  Estrogen receptor (ER): Positive (3+, 40%)  Progesterone receptor (PR): Positive (3+, 10%)  Pathologic Stage Classification (pTNM, AJCC 8th Edition):  Primary Tumor (Invasive Carcinoma) (pT):  pTX: Primary tumor cannot be assessed  Regional Lymph Nodes (pN):  pN1a:     Metastases in 1 to 3 axillary lymph nodes, at least 1 metastasis  larger than 2.0 mm  Nipple: Not applicable  Skin/dermis: Unremarkable  Skeletal Muscle: Not applicable  Representative block(s) for potential ancillary testing: positive lymph  node (B10, B11, 90%)  Additional findings: Proliferative fibrocystic changes, calcific  atherosclerosis of breast vessels, microcalcification, multiple biopsy site  changes.  Additional comments:  The lumpectomy specimen is adequately sampled. All  the biopsy sites and fibrous areas are examined microscopically.  Immunostains were performed on the largest positive lymph node and the  metastatic carcinoma is   predominantly positive for GATA3, patchy positive  for mammoglobin and BRST-2. Tumor cells are negative for CK7, CK20, p63,  PAX8, CD56 and synaptophysin. The immunostain pattern confirms metastatic  carcinoma from breast origin.  Tumor cells are also positive for ER and PR.  It is unusual but rare cases of axillary node metastatic  carcinoma without  definitive primary breast carcinoma have been reported. Clinical and  radiological correlation is recommended.  \ast is highly suggestive of malignancy.  Biopsy is recommended.  There has been an attempt to biopsy this mass at outside facility and there are two marker clips anterior to this mass (not within this mass). one of the outside biopsies were benign and the other one showed ADH. Suggest repeating biopsy of the mass to determine the primary site of cancer.  2.  Abnormal lymph nodes in the right axilla are a known malignancy.    Outside biopsy demonstrated metastatic disease in the axilla. Multiple abnormal lymph nodes are seen in the right axilla on today's exam.  ASSESSMENT:  BI-RADS Category 5:  Highly Suggestive of Malignancy    Pathology (see media), 01/19/2017 :  FINAL PATHOLOGIC DIAGNOSIS:  Outside slides received ([facility], [place], [place], "[id number]",  01/19/17)  A: Right axillary lymph node, biopsy:  -Metastatic carcinoma, consistent with breast primary, see comment.  COMMENT: The tumor involves multiple fragments. A review of the outside  immunohistochemical slides, with their appropriately reactive controls,  showed the tumor to be strongly positive for GATA3 and weakly positive for  CK7, supporting the rendered diagnosis.  The tumor also displayed the following immunoprofile:  Estrogen receptor (ER): Positive (3+, >95%)  Progesterone receptor (PR): Negative  Her2/neu (IHC): Negative (score 0, ASCO/CAP 2018)  SPECIMEN(S) SUBMITTED: A: Outside slide review  CLINICAL HISTORY: 66-year-old female with no clinical history. Please  review outside slides.  GROSS DESCRIPTION: A: Received for review from [facility], [place],  [place], are eight slides labeled "[id number]" representing right axillary lymph  node biopsy from 01/19/17. Also received is a copy of the surgical  pathology report corresponding to the numbers on the slides.  /ws  CONFIDENTIAL HEALTH INFORMATION: Health  Care information is personal and  sensitive information. If it is being faxed to you it is done so under  appropriate authorization from the patient or under circumstances that do  not require patient authorization. You, the recipient, are obligated to  maintain it in a safe, secure and confidential manner. Re-disclosure  without additional patient consent or as permitted by law is prohibited.  Unauthorized re-disclosure or failure to maintain confidentiality could  subject you to penalties described in federal and state law.  If you have  received this report or facsimile in error, please notify the [facility] immediately and destroy the received document(s).  cc:  [facility]       Department of Pathology       [address]       [place], [place] [address]       Phone: [phone]  Final Diagnosis performed by Farnaz Hasteh, M.D. ([id number]) Electronically  signed 03/25/17 8:52   Interpretation performed at [facility],  [address], [place], [address] Electronic Signature derived  from a single controlled access password.    Current Medications:    aspirin 81 MG tablet Take 81 mg by mouth daily.   Carboxymeth-Glycerin-Polysorb (REFRESH OPTIVE ADVANCED) 0.5-1-0.5 % SOLN    docusate sodium (COLACE) 250 MG capsule Take 1 capsule (250 mg) by mouth daily.   Fish Oil-Cholecalciferol (OMEGA-3 FISH OIL/VITAMIN D3) 1000-1000 MG-UNIT CAPS    gabapentin (NEURONTIN) 300 MG capsule   Take 1 capsule (300 mg) by mouth 3 times daily.   glimepiride (AMARYL) 4 MG tablet Take 4 mg by mouth every morning.   losartan-hydrochlorothiazide (HYZAAR) 100-25 MG tablet Take 1 tablet by mouth daily.   metFORMIN (GLUCOPHAGE) 1000 MG tablet Take 1,000 mg by mouth 2 times daily (with meals).   Multiple Vitamins-Minerals (VISION FORMULA 2 PO)    omeprazole (PRILOSEC) 40 MG capsule Take 40 mg by mouth daily.   ondansetron (ZOFRAN) 4 MG tablet Take 4 mg by mouth every 8 hours as needed for Nausea/Vomiting.    oxyCODONE (ROXICODONE) 5 MG immediate release tablet Take 1 tablet (5 mg) by mouth every 4 hours as needed for Severe Pain (Pain Score 7-10).   simvastatin (ZOCOR) 10 MG tablet Take 10 mg by mouth every evening.       Allergies:  Patient is allergic to morphine; phenylephrine; and benadryl [diphenhydramine].    Past Medical History:  Patient  has a past medical history of Diabetes mellitus (CMS-HCC), Hypertension, and Migraine.    Past Surgical History:  The patient's  has a past surgical history that includes Cesarean section, classic; Total abdominal hysterectomy w/ bilateral salpingoophorectomy (1999); and Breast lumpectomy (1996).    Social History:  The patient's  reports that  has never smoked. she has never used smokeless tobacco. She reports that she does not drink alcohol or use drugs.    Family History:  Family History   Adopted: Yes   Family history unknown: Yes       Personal Risk Factors:  Menarche @ age 33  LMP 29 s/p hysterectomy  G2 P2 A0  ; first delivery @ age 53  Denies smoking history  Denies alcohol use  HRT x 1 mo 1990s, stopped  Family History:    Surgical Oncologic History:  Stage at first diagnosis: breast cancer tXn1  Surgical Procedure: lt image loc alnd  Chemotherapy: p  Radiation: p    Review of Systems:   See HPI for pertinent positives  Gen: denies fevers/chills, fatigue, unintentional weight loss, night sweats  HEENT: Denies dizziness, vision changes, loss of hearing, nosebleeds, dysphagia  CV: denies chest pain, palpitations, shortness of breath, orthopnea, edema  Resp: denies cough, shortness of breath, hemoptysis  Breast: denies erythema, inflammation, nipple discharge  GI: denies abdominal pain, nausea/vomiting, diarrhea/constipation, or bleeding  GU: denies dysuira, hematuria, polyuria  Neuro: denies headache, dizziness, loss of consciousness    PHYSICAL EXAM:  BP 141/65 (BP Location: Left arm, BP Patient Position: Sitting, BP cuff size: Regular)    Pulse 91    Temp 98.4 F  (36.9 C) (Oral)    Resp 16    Ht 4' 7" (1.397 m)    Wt 55.3 kg (121 lb 14.6 oz)    LMP  (LMP Unknown)    SpO2 99%    BMI 28.34 kg/m    GENERAL APPEARANCE: The patient is an alert, female in no acute distress.  SKIN: Without visible lesion  EAR, NOSE, & THROAT:  NC, AT. EOMI. Oropharynx is clear.  LYMPH NODES: There is no cervical, supraclavicular, infraclavicular, or axillary adenopathy.  CHEST: The lungs are clear to auscultation bilaterally.  HEART: Regular rate and rhythm, no murmurs, rubs, or gallops appreciated.  BREASTS: Right well healing inc breast and axilla drain removed  No ln palp Left breast no masses, no LAD, no skin nor nipple changes.   ABDOMEN:  The abdomen is soft, nondistended.  EXTREMITES: There is no edema or cyanosis.  NEURO:  Mental status is normal.  No focal neurologic findings.      ASSESSMENT/PLAN:  In summary Alyanah Greenstreet is a 66 year old woman who presents with Right breast cancer TXN1  1. Malignant neoplasm of breast, stage 2, unspecified laterality (CMS-HCC)  1. F/u Dr. Razzaque for adjvuant tx.  We will disucss her case at tumor board and I will call his office after  2. OT/DME for lymphedema prevention  3. F/u 1 mo

## 2017-05-13 ENCOUNTER — Encounter (HOSPITAL_BASED_OUTPATIENT_CLINIC_OR_DEPARTMENT_OTHER): Payer: Self-pay | Admitting: Surgical Oncology

## 2017-06-07 ENCOUNTER — Telehealth (HOSPITAL_BASED_OUTPATIENT_CLINIC_OR_DEPARTMENT_OTHER): Payer: Self-pay

## 2017-06-07 NOTE — Telephone Encounter (Signed)
Language line Spanish interpreter ID [phone] used to translate call     S/w pt. To assess how she is feeling post op. Pt. Is s/p RIGHT SAVI LOCALIZED LUMPECTOMY,RIGHT AXILLARY LYMPH NODE DISSECTION on 05/03/17    Pt. Will be seeing OT on 06/13/17    No answer from pt's listed phone - VM left asking for pt. To call NCM back at her soonest convenience to see how pt. Is feeling post op.     Per Dr. Benjie Karvonen, if pt. Is not having continued swelling under her arm then she does not need to come in on 06/13/17 and can see Dr. Benjie Karvonen back in 6 months.

## 2017-06-09 ENCOUNTER — Ambulatory Visit (HOSPITAL_BASED_OUTPATIENT_CLINIC_OR_DEPARTMENT_OTHER): Payer: Medicare Other | Admitting: Surgical Oncology

## 2017-06-13 ENCOUNTER — Ambulatory Visit (HOSPITAL_BASED_OUTPATIENT_CLINIC_OR_DEPARTMENT_OTHER): Payer: Medicare Other | Admitting: Rehabilitative and Restorative Service Providers"

## 2017-06-13 ENCOUNTER — Ambulatory Visit (HOSPITAL_BASED_OUTPATIENT_CLINIC_OR_DEPARTMENT_OTHER): Payer: Medicare Other | Admitting: Surgical Oncology

## 2017-12-09 ENCOUNTER — Encounter (HOSPITAL_BASED_OUTPATIENT_CLINIC_OR_DEPARTMENT_OTHER): Payer: Self-pay | Admitting: Surgical Oncology

## 2017-12-09 DIAGNOSIS — C50919 Malignant neoplasm of unspecified site of unspecified female breast: Principal | ICD-10-CM

## 2017-12-15 ENCOUNTER — Ambulatory Visit (HOSPITAL_BASED_OUTPATIENT_CLINIC_OR_DEPARTMENT_OTHER): Payer: Medicare Other | Admitting: Surgical Oncology

## 2017-12-26 ENCOUNTER — Telehealth (HOSPITAL_BASED_OUTPATIENT_CLINIC_OR_DEPARTMENT_OTHER): Payer: Self-pay

## 2017-12-26 ENCOUNTER — Ambulatory Visit (HOSPITAL_BASED_OUTPATIENT_CLINIC_OR_DEPARTMENT_OTHER): Payer: Medicare Other

## 2017-12-26 ENCOUNTER — Encounter (HOSPITAL_BASED_OUTPATIENT_CLINIC_OR_DEPARTMENT_OTHER): Payer: Self-pay

## 2017-12-26 ENCOUNTER — Ambulatory Visit (HOSPITAL_BASED_OUTPATIENT_CLINIC_OR_DEPARTMENT_OTHER): Payer: Medicare Other | Admitting: Surgical Oncology

## 2017-12-26 NOTE — Telephone Encounter (Signed)
NCM called [facility] and informed them that pt. Will not be coming to mammogram appt. Today d/t transpiration issues.

## 2017-12-26 NOTE — Telephone Encounter (Signed)
TC from [name], pt's daughter.  She called to cancel pt's appt with Dr. Benjie Karvonen today at 10:20am because their driver never showed up.      [name] says she will call back to r/s the appt after she reschedules pt's imaging appt.

## 2018-02-06 ENCOUNTER — Ambulatory Visit: Payer: Medicare Other | Attending: Surgical Oncology | Admitting: Surgical Oncology

## 2018-02-06 ENCOUNTER — Encounter (HOSPITAL_BASED_OUTPATIENT_CLINIC_OR_DEPARTMENT_OTHER): Payer: Self-pay | Admitting: Surgical Oncology

## 2018-02-06 ENCOUNTER — Ambulatory Visit (HOSPITAL_BASED_OUTPATIENT_CLINIC_OR_DEPARTMENT_OTHER)
Admission: RE | Admit: 2018-02-06 | Discharge: 2018-02-06 | Disposition: A | Discharge status: Home Health | Payer: Medicare Other | Attending: Surgical Oncology | Admitting: Surgical Oncology

## 2018-02-06 ENCOUNTER — Telehealth (HOSPITAL_BASED_OUTPATIENT_CLINIC_OR_DEPARTMENT_OTHER): Payer: Self-pay

## 2018-02-06 VITALS — BP 178/71 | HR 67 | Temp 98.4°F | Resp 16 | Ht <= 58 in | Wt 120.4 lb

## 2018-02-06 DIAGNOSIS — Z853 Personal history of malignant neoplasm of breast: Secondary | ICD-10-CM

## 2018-02-06 DIAGNOSIS — Z9889 Other specified postprocedural states: Secondary | ICD-10-CM | POA: Insufficient documentation

## 2018-02-06 DIAGNOSIS — C50919 Malignant neoplasm of unspecified site of unspecified female breast: Secondary | ICD-10-CM

## 2018-02-06 DIAGNOSIS — Z08 Encounter for follow-up examination after completed treatment for malignant neoplasm: Secondary | ICD-10-CM

## 2018-02-06 NOTE — Patient Instructions (Signed)
[facility]  PHONE LIST FOR PATIENTS    Hours of operation:  Monday-Friday 8:00 - 5:00pm. Closed Holidays and Weekends    AFTER HOURS EMERGENCY NUMBER:    For urgent questions after hours, or on weekends/holidays, contact the [facility] paging operator at [phone], and ask to speak to the doctor on call for Dr. Sarah Blair    To make a follow up appointment, please call the call the [facility] front desk at: [phone]    For scheduling issues, insurance or disability questions, please call,  Administrative Assistant Lori Morgan:Phone: [phone], Fax: [phone]     For nursing questions during weekdays, please call:   Daris Aristizabal, RN, BSN, Nurse Case Manager at [phone]

## 2018-02-06 NOTE — Telephone Encounter (Signed)
-----   Message from Clent Jacks, RN sent at 02/06/2018  2:37 PM PST -----  Regarding: DME

## 2018-02-06 NOTE — Telephone Encounter (Signed)
Authorization Status - No Auth Required.    Contacted patient's daughter Dedra Skeens and informed no Josem Kaufmann is required for DME Compression Sleeve and Gauntlet order.  Also made aware order is not a covered benefit with Medicare which will be an out of pocket cost.  Selected order to go to [facility] in [place].  Will await call for scheduling.

## 2018-02-06 NOTE — Interdisciplinary (Signed)
Follow up appointment with Dr. Benjie Stewart   Exam done and records reviewed.   Ambulatory. Alert and oriented x 3    [name], [name], present to translate appt.     Nursing assessment:   67 year old old female with breast cancer hx Stewart/p RIGHT SAVI LOCALIZED LUMPECTOMY (Breast)RIGHT AXILLARY LYMPH NODE DISSECTION Stewart/p xRT and currently on Arimidex  Side effects reported: None  Surgery site: no Stewart/Stewart of infection  Pain: Off and on at right axilla. Pt. Does UE excercies at home which pt. States help. Refuses OT at this time.   Constipation: denies  Fatigue: denies  Appetite: Good   ROM: OK  Imaging result reviewed with the patient by Dr. Benjie Stewart    Wellbeing Assessment :   Patient accompanied by daughter.   Transportation issue: None  Barriers to learning: None.  Emotional support given     Interventions:  Supportive care given. Personal difficulties related to treatment addressed to patient'Stewart satisfaction. Patient/family concerns addressed.     Education:  Contact information, AVS given and reviewed. Questions asked and answered to patient'Stewart satisfaction and understanding    Plan:  -f/u in 1 year with a mammogram  -Compression sleeve ordered, order given to pt. With vendor list and instructions on how to obtain compression sleeve with good understanding.   -Pt. Refuses OT at this time

## 2018-02-06 NOTE — Progress Notes (Signed)
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  She presented with axillary mets.  Unable to find primary tumor in breast. . She has a mass in the Right breast 9:00, N+4cm that measures 2.1cm which attempted biopsy twice at outside facility with benign/ADH findings. Had change on screening mammogram 10/2016, prior mammo 2014. Workup revealed three masses in Right breast and abnormal lymph node. Breast masses all returned benign on biopsy, except LN shows metastatic disease c/w breast primary. Repeat bx in the breast at [place] was also benign.  We discussed in these cases of + axillary node with unknown primary there is no difference in survival between mast + ALND vs radn and ALND accoeding to small studies.  She wants BCT so I recommended we remove that area with atypia + ALND.  She had a lump + ALND for IDC T0N1 3/19 nodes.  She denoted mild arm edema intermittently improves with exercise    Surgical Oncologic History:  Stage at first diagnosis: breast cancer tXn1  Surgical Procedure: lt image loc alnd  Chemotherapy: TC, arimedex  Radiation: radiation      ROS: Denies fevers, chills, unintentional wt loss, new HA, vision change, CP, cough, SOB, N/V, hematuria, dysuria, new masses/lumps, nipple d/c, extremity swelling, bone pain.      A: Right breast, lumpectomy:  -Mammary tissue with rare focus of atypical ductal hyperplasia in the  background of proliferative fibrocystic changes and microcalcification.  -No DCIS or invasive carcinoma identified.  -Multiple biopsy site changes.  -Pathologic stage: pTXN1a.  B: Right axillary contents, excision:  -Metastatic carcinoma in three of three of nineteen lymph nodes (3/19).  -The largest size of metastatic carcinoma  measures 4.3 cm with extranodal  extension.  COMMENT:  BREAST CANCER: SYNOPTIC REPORT:  Specimen/Procedure: Excision (less than total mastectomy)  Lymph Node Sampling: Axillary dissection  Specimen Laterality:  Right  Tumor Focality: Not applicable  Invasive Tumor Type: Invasive tumor not identified  Duct Carcinoma In Situ (DCIS): Not identified, rare focus of ADH is seen  close to one of the biopsy sites  Lymph Nodes Positive /Total Lymph Nodes Sampled: 3/19  Number of lymph nodes with macrometastases: 3  Number of lymph nodes with micrometastases: 0  Diameter of largest metastasis: 4.3 cm  Extranodal extension and size: present, 1.0 cm  Treatment Effect(s): Response to Presurgical (Neoadjuvant) Therapy: Not  applicable.  Her2/neu Status (IHC) : Negative ( score 0, ASCO/CAP 2018)  Her2/neu Status (FISH): Pending on current specimen  Hormone Receptor Status (ER/PR):  Estrogen receptor (ER): Positive (3+, 40%)  Progesterone receptor (PR): Positive (3+, 10%)  Pathologic Stage Classification (pTNM, AJCC 8th Edition):  Primary Tumor (Invasive Carcinoma) (pT):  pTX: Primary tumor cannot be assessed  Regional Lymph Nodes (pN):  pN1a:   Metastases in 1 to 3 axillary lymph nodes, at least 1 metastasis  larger than 2.0 mm  Nipple: Not applicable  Skin/dermis: Unremarkable  Skeletal Muscle: Not applicable  Representative block(s) for potential ancillary testing: positive lymph  node (B10, B11, 90%)  Additional findings: Proliferative fibrocystic changes, calcific  atherosclerosis of breast vessels, microcalcification, multiple biopsy site  changes.  Pathology (see media), 01/19/2017 :  FINAL PATHOLOGIC DIAGNOSIS:  Outside slides received [address], [place], [place], [id number]  01/19/17)  A: Right axillary lymph node, biopsy:  -Metastatic carcinoma, consistent with breast primary, see comment.  COMMENT: The tumor involves multiple fragments. A review of the outside  immunohistochemical slides, with their  appropriately reactive controls,  showed the tumor to be strongly positive for GATA3 and weakly positive for  CK7, supporting the rendered diagnosis.  The tumor also displayed the following immunoprofile:  Estrogen receptor (ER): Positive (3+, >95%)  Progesterone receptor (PR): Negative  Her2/neu (IHC): Negative (score 0, ASCO/CAP 2018)  SPECIMEN(S) SUBMITTED: A: Outside slide review  CLINICAL HISTORY: 67 year old female with no clinical history. Please  review outside slides.  GROSS DESCRIPTION: A: Received for review from [facility], [place],  [place], are eight slides labeled "[phone]" representing right axillary lymph  node biopsy from 01/19/17. Also received is a copy of the surgical  pathology report corresponding to the numbers on the slides. [name]  CONFIDENTIAL HEALTH INFORMATION: Health Care information is personal and  sensitive information. If it is being faxed to you it is done so under  appropriate authorization from the patient or under circumstances that do  not require patient authorization. You, the recipient, are obligated to  maintain it in a safe, secure and confidential manner. Re-disclosure  without additional patient consent or as permitted by law is prohibited.  Unauthorized re-disclosure or failure to maintain confidentiality could  subject you to penalties described in federal and state law. If you have  received this report or facsimile in error, please notify the [facility] immediately and destroy the received document(s).  cc: [facility]    Department of Pathology    [address]    [place], [place] [address]    Phone: [phone]  Final Diagnosis performed by Ronnell Guadalajara, M.D. [id number]) Electronically  signed 03/25/17 8:52  Interpretation performed at [place],  [place], [place], [place], [address] Electronic Signature derived  from a single controlled access password.    Current Medications:  aspirin 81 MG tablet, Take 81 mg  by mouth daily.  Carboxymeth-Glycerin-Polysorb (REFRESH OPTIVE ADVANCED) 0.5-1-0.5 % SOLN,   docusate sodium (COLACE) 250 MG capsule, Take 1 capsule (250 mg) by mouth daily.  Fish Oil-Cholecalciferol (OMEGA-3 FISH OIL/VITAMIN D3) 1000-1000 MG-UNIT CAPS,   gabapentin (NEURONTIN) 300 MG capsule, Take 1 capsule (300 mg) by mouth 3 times daily.  glimepiride (AMARYL) 4 MG tablet, Take 4 mg by mouth every morning.  losartan-hydrochlorothiazide (HYZAAR) 100-25 MG tablet, Take 1 tablet by mouth daily.  metFORMIN (GLUCOPHAGE) 1000 MG tablet, Take 1,000 mg by mouth 2 times daily (with meals).  Multiple Vitamins-Minerals (VISION FORMULA 2 PO),   omeprazole (PRILOSEC) 40 MG capsule, Take 40 mg by mouth daily.  ondansetron (ZOFRAN) 4 MG tablet, Take 4 mg by mouth every 8 hours as needed for Nausea/Vomiting.  oxyCODONE (ROXICODONE) 5 MG immediate release tablet, Take 1 tablet (5 mg) by mouth every 4 hours as needed for Severe Pain (Pain Score 7-10).  simvastatin (ZOCOR) 10 MG tablet, Take 10 mg by mouth every evening.        Allergies:  Patient is allergic to morphine; phenylephrine; and benadryl [diphenhydramine].    Past Medical History:  Patient  has a past medical history of Diabetes mellitus (CMS-HCC), Hypertension, and Migraine.    Past Surgical History:  The patient's  has a past surgical history that includes Cesarean section,  classic; Total abdominal hysterectomy w/ bilateral salpingoophorectomy (1999); and Breast lumpectomy (1996).    Social History:  The patient's  reports that she has never smoked. She has never used smokeless tobacco. She reports that she does not drink alcohol or use drugs.    Family History:  Family History   Adopted: Yes   Family history unknown: Yes       Personal Risk Factors:  Menarche @ age 44  LMP 65 s/p hysterectomy  G2 P2 A0  ; first delivery @ age 70  Denies smoking history  Denies alcohol use  HRT x 1 mo 1990s, stopped  Family History:      Review of Systems:   See HPI for pertinent  positives  Gen: denies fevers/chills, fatigue, unintentional weight loss, night sweats  HEENT: Denies dizziness, vision changes, loss of hearing, nosebleeds, dysphagia  CV: denies chest pain, palpitations, shortness of breath, orthopnea, edema  Resp: denies cough, shortness of breath, hemoptysis  Breast: denies erythema, inflammation, nipple discharge  GI: denies abdominal pain, nausea/vomiting, diarrhea/constipation, or bleeding  GU: denies dysuira, hematuria, polyuria  Neuro: denies headache, dizziness, loss of consciousness    PHYSICAL EXAM:  BP 178/71 (BP Location: Left arm, BP Patient Position: Sitting, BP cuff size: Regular)   Pulse 67   Temp 98.4 F (36.9 C) (Oral)   Resp 16   Ht '4\' 7"'$  (1.397 m)   Wt 54.6 kg (120 lb 5.9 oz)   LMP  (LMP Unknown)   SpO2 99%   BMI 27.98 kg/m    GENERAL APPEARANCE: The patient is an alert, female in no acute distress.  SKIN: Without visible lesion  EAR, NOSE, & THROAT:  NC, AT. EOMI. Oropharynx is clear.  LYMPH NODES: There is no cervical, supraclavicular, infraclavicular, or axillary adenopathy.  CHEST: The lungs are clear to auscultation bilaterally.  HEART: Regular rate and rhythm, no murmurs, rubs, or gallops appreciated.  BREASTS: Right well healed inc breast and axilla acute radn change  No ln palp Left breast no masses, no LAD, no skin nor nipple changes.   ABDOMEN:  The abdomen is soft, nondistended.  EXTREMITES: There is no edema or cyanosis.  NEURO:  Mental status is normal.  No focal neurologic findings.    DIAGNOSTIC MAMMOGRAM WITH DIGITAL BREAST TOMOSYNTHESIS FINDINGS:  The breasts are heterogeneously dense, which may obscure small masses.    In the left breast there are no masses, asymmetries, or suspicious calcifications.    There is post-lumpectomy changes and post-radiation changes in the right breast.  No suspicious findings.    IMPRESSION / RECOMMENDATION:  There is no evidence of malignancy in the left breast.    Post-lumpectomy changes and  post-radiation changes in the right breast are benign.    Normal interval follow-up mammogram in 1 year is recommended.    ASSESSMENT:  BI-RADS Category 2:  Benign Finding(s)    ASSESSMENT/PLAN:  In summary [name] is a 67 year old woman stage 2 IDC no evidence of recurrence  1. Pt declines OT since she liver far  2. Compression sleeve  3. F/u in 1 yr after imaging

## 2019-02-01 ENCOUNTER — Ambulatory Visit (HOSPITAL_BASED_OUTPATIENT_CLINIC_OR_DEPARTMENT_OTHER): Payer: Medicare Other | Admitting: Surgical Oncology

## 2019-02-19 ENCOUNTER — Ambulatory Visit (HOSPITAL_BASED_OUTPATIENT_CLINIC_OR_DEPARTMENT_OTHER): Payer: Medicare Other

## 2019-02-19 ENCOUNTER — Ambulatory Visit (HOSPITAL_BASED_OUTPATIENT_CLINIC_OR_DEPARTMENT_OTHER): Payer: Medicare Other | Admitting: Surgical Oncology

## 2019-02-26 ENCOUNTER — Ambulatory Visit (HOSPITAL_BASED_OUTPATIENT_CLINIC_OR_DEPARTMENT_OTHER): Admit: 2019-02-26 | Payer: Medicare Other

## 2019-02-26 ENCOUNTER — Encounter (HOSPITAL_BASED_OUTPATIENT_CLINIC_OR_DEPARTMENT_OTHER): Payer: Self-pay

## 2019-02-26 ENCOUNTER — Ambulatory Visit (HOSPITAL_BASED_OUTPATIENT_CLINIC_OR_DEPARTMENT_OTHER): Payer: Medicare Other | Admitting: Surgical Oncology

## 2019-03-06 ENCOUNTER — Telehealth (HOSPITAL_BASED_OUTPATIENT_CLINIC_OR_DEPARTMENT_OTHER): Payer: Self-pay

## 2019-03-06 NOTE — Telephone Encounter (Signed)
Patient daughter Dedra Skeens [phone] calling states that someone usually schedules pt's appt together for appt and mammo. Please assist in scheduling and return daughter call.

## 2019-03-07 NOTE — Telephone Encounter (Signed)
Left her vm to call us back

## 2019-03-20 ENCOUNTER — Telehealth (HOSPITAL_BASED_OUTPATIENT_CLINIC_OR_DEPARTMENT_OTHER): Payer: Self-pay | Admitting: Surgical Oncology

## 2019-03-20 NOTE — Telephone Encounter (Signed)
Visitor Exception Request for [place]    Date of Appointment: 04/05/2019     Dx or Reason for visit: f/up breast cancer    Surgery visitor exception requested by Dr. Benjie Karvonen.    Reason: Other Patient with diabetic partial blindness and disorientation*    Routing to attending provider and executive approver, Dr. Marjorie Smolder, for review.           (upon provider reply, document in the encounter and use smartphrase       .[email or web address])

## 2019-03-21 NOTE — Telephone Encounter (Signed)
approve

## 2019-03-22 NOTE — Telephone Encounter (Signed)
Visitor approval response from Dr. Califano:    COVID Visitor Approval Response: Blanket visitor is APPROVED by executive approver J.C. and patient has been notified. "Visitor approved by J.C.." note added to the beginning of appointment note and in the EPIC Specialty Notes' on Patient Snapshot "Blanket Visitor Approval" has been added.

## 2019-03-30 ENCOUNTER — Encounter: Payer: Self-pay | Admitting: Hospital

## 2019-04-04 NOTE — Interdisciplinary (Signed)
Opened in error

## 2019-04-05 ENCOUNTER — Ambulatory Visit
Admission: RE | Admit: 2019-04-05 | Discharge: 2019-04-05 | Disposition: A | Discharge status: Home / Self Care | Payer: Medicare Other | Attending: Surgical Oncology | Admitting: Surgical Oncology

## 2019-04-05 ENCOUNTER — Telehealth (HOSPITAL_BASED_OUTPATIENT_CLINIC_OR_DEPARTMENT_OTHER): Payer: Self-pay

## 2019-04-05 ENCOUNTER — Encounter (HOSPITAL_BASED_OUTPATIENT_CLINIC_OR_DEPARTMENT_OTHER): Payer: Self-pay | Admitting: Surgical Oncology

## 2019-04-05 ENCOUNTER — Ambulatory Visit (HOSPITAL_BASED_OUTPATIENT_CLINIC_OR_DEPARTMENT_OTHER): Payer: Medicare Other | Admitting: Surgical Oncology

## 2019-04-05 ENCOUNTER — Other Ambulatory Visit: Payer: Self-pay

## 2019-04-05 VITALS — BP 189/71 | HR 80 | Temp 97.7°F | Resp 16 | Ht <= 58 in | Wt 129.0 lb

## 2019-04-05 DIAGNOSIS — Z170 Estrogen receptor positive status [ER+]: Secondary | ICD-10-CM

## 2019-04-05 DIAGNOSIS — C50911 Malignant neoplasm of unspecified site of right female breast: Secondary | ICD-10-CM

## 2019-04-05 DIAGNOSIS — Z08 Encounter for follow-up examination after completed treatment for malignant neoplasm: Secondary | ICD-10-CM

## 2019-04-05 DIAGNOSIS — I890 Lymphedema, not elsewhere classified: Secondary | ICD-10-CM

## 2019-04-05 DIAGNOSIS — Z853 Personal history of malignant neoplasm of breast: Secondary | ICD-10-CM | POA: Insufficient documentation

## 2019-04-05 DIAGNOSIS — Z9889 Other specified postprocedural states: Secondary | ICD-10-CM

## 2019-04-05 DIAGNOSIS — C50411 Malignant neoplasm of upper-outer quadrant of right female breast: Secondary | ICD-10-CM

## 2019-04-05 DIAGNOSIS — Z923 Personal history of irradiation: Secondary | ICD-10-CM

## 2019-04-05 DIAGNOSIS — C50919 Malignant neoplasm of unspecified site of unspecified female breast: Secondary | ICD-10-CM

## 2019-04-05 MED ORDER — CALCIUM PO: ORAL | Status: AC

## 2019-04-05 NOTE — Interdisciplinary (Deleted)
69 year old patient with breast cancer here for 1year follow up with Dr Benjie Karvonen    Diagnosis:H/O Rt. Breast [place]               Surgery: RT Breast lumpectomy 05/03/2017  XRT:No  Systemic therapy:*    Imaging -  04/05/19 Mammogram**    Nursing assessment  Pt ***unaccompanied by ***  Alert and oriented x 4  ***Denies breast related complaints    Wellbeing Assessment :   Transportation issue: None  Barriers to learning: None.  Emotional support given          Interventions:  Supportive care given. Personal difficulties related to treatment addressed to patient's satisfaction. Patient/family concerns addressed.     Education:  Contact information, AVS given and reviewed. Questions asked and answered to patient's satisfaction and understanding    Plan:  -Mammo with RV in ***    [place] Spanish interpreter, ***, present to translate appt.     Marrtii/Language line Spanish *** interpreter ID *** used to translate appt/call

## 2019-04-05 NOTE — Progress Notes (Addendum)
Surgical Oncology:  Demographics:  Date: April 05, 2019  Patient Name: [name]  Medical Record #: [id number]  DOB: 07/05/1950   Age: 68 year old    Sex: female    History of Present Illness:   [name] is a 69 year old female with stage 2 IDC.  She presented with axillary mets.  Unable to find primary tumor in breast.  She has a mass in the Right breast 9:00, N+4cm that measures 2.1cm which attempted biopsy twice at outside facility with benign/ADH findings. Had change on screening mammogram 10/2016, prior mammo 2014. Workup revealed three masses in Right breast and abnormal lymph node. Breast masses all returned benign on biopsy, except LN showed metastatic disease c/w breast primary. Repeat bx in the breast at [place] was also benign. We discussed in these cases of + axillary node with unknown primary there is no difference in survival between mast + ALND vs radn and ALND according to small studies. She wanted BCT and had a lump + ALND for IDC T0N1 3/19 nodes. She completed radiation therapy. She is on Arimidex, with plan to continue AI for 10 years.    She returns to clinic accompanied by son [name], for clinical breast check and f/u. She states she is doing well overall. She denies any new lumps/masses, skin changes or nipple discharge. She notes numbness and occasional shooting pain in axillary region, also with mild RUE arm edema. She declines OT since she lives far, however does lymphedema ppx exercises at home. She completed radiation in 10/2018. Had a persistent dry cough during treatment, which is still improving. She had breast imaging completed this morning which was birads 2: benign.      Surgical Oncologic History:  Stage at first diagnosis: breast cancer tXn1  Surgical Procedure: rt image loc ALND  Chemotherapy: TC, arimidex  Radiation: radiation       A: Right breast, lumpectomy:  -Mammary tissue with rare focus of atypical ductal hyperplasia in the background of proliferative fibrocystic changes and  microcalcification.  -No DCIS or invasive carcinoma identified.  -Multiple biopsy site changes.  -Pathologic stage: pTXN1a.  B: Right axillary contents, excision:  -Metastatic carcinoma in three of three of nineteen lymph nodes (3/19).  -The largest size of metastatic carcinoma measures 4.3 cm with extranodal extension.  COMMENT:  BREAST CANCER: SYNOPTIC REPORT:  Specimen/Procedure: Excision (less than total mastectomy)  Lymph Node Sampling: Axillary dissection  Specimen Laterality:  Right  Tumor Focality: Not applicable  Invasive Tumor Type: Invasive tumor not identified  Duct Carcinoma In Situ (DCIS): Not identified, rare focus of ADH is seen close to one of the biopsy sites  Lymph Nodes Positive /Total Lymph Nodes Sampled: 3/19  Number of lymph nodes with macrometastases: 3  Number of lymph nodes with micrometastases: 0  Diameter of largest metastasis: 4.3 cm  Extranodal extension and size: present, 1.0 cm  Treatment Effect(s): Response to Presurgical (Neoadjuvant) Therapy: Not applicable.  Her2/neu Status (IHC) : Negative ( score 0, ASCO/CAP 2018)  Her2/neu Status (FISH): Pending on current specimen  Hormone Receptor Status (ER/PR):  Estrogen receptor (ER): Positive (3+, 40%)  Progesterone receptor (PR): Positive (3+, 10%)  Pathologic Stage Classification (pTNM, AJCC 8th Edition):  Primary Tumor (Invasive Carcinoma) (pT):  pTX: Primary tumor cannot be assessed  Regional Lymph Nodes (pN):  pN1a:   Metastases in 1 to 3 axillary lymph nodes, at least 1 metastasis larger than 2.0 mm  Nipple: Not applicable  Skin/dermis: Unremarkable  Skeletal Muscle: Not  applicable  Representative block(s) for potential ancillary testing: positive lymph node (B10, B11, 90%)  Additional findings: Proliferative fibrocystic changes, calcific atherosclerosis of breast vessels, microcalcification, multiple biopsy site changes.    Pathology (see media), 01/19/2017 :  FINAL PATHOLOGIC DIAGNOSIS:  Outside slides received [address] [place], [place], [id number]",  01/19/17)  A: Right axillary lymph node, biopsy:  -Metastatic carcinoma, consistent with breast primary, see comment.  COMMENT: The tumor involves multiple fragments. A review of the outside  immunohistochemical slides, with their appropriately reactive controls,  showed the tumor to be strongly positive for GATA3 and weakly positive for  CK7, supporting the rendered diagnosis.  The tumor also displayed the following immunoprofile:  Estrogen receptor (ER): Positive (3+, >95%)  Progesterone receptor (PR): Negative  Her2/neu (IHC): Negative (score 0, ASCO/CAP 2018)  SPECIMEN(S) SUBMITTED: A: Outside slide review    Current Medications:    .  Carboxymeth-Glycerin-Polysorb (REFRESH OPTIVE ADVANCED) 0.5-1-0.5 % SOLN,     .  glimepiride (AMARYL) 4 MG tablet, Take 4 mg by mouth every morning.    Marland Kitchen  losartan-hydrochlorothiazide (HYZAAR) 100-25 MG tablet, Take 1 tablet by mouth daily.    .  metFORMIN (GLUCOPHAGE) 1000 MG tablet, Take 1,000 mg by mouth 2 times daily (with meals).    .  Multiple Vitamins-Minerals (VISION FORMULA 2 PO),     .  omeprazole (PRILOSEC) 40 MG capsule, Take 40 mg by mouth daily.    .  simvastatin (ZOCOR) 10 MG tablet, Take 10 mg by mouth every evening.        Allergies:  Patient is allergic to morphine; phenylephrine; and benadryl [diphenhydramine].    Past Medical History:  Patient  has a past medical history of Diabetes mellitus (CMS-HCC), Hypertension, and Migraine.    Past Surgical History:  The patient  has a past surgical history that includes Cesarean section, classic; Total abdominal hysterectomy w/ bilateral salpingoophorectomy (1999); and Breast lumpectomy (1996).    Social History:  The patient  reports that she has never smoked. She has never used smokeless tobacco. She reports that she does not drink alcohol or use drugs.    Family History:  Family History   Adopted: Yes   Family history unknown: Yes       Personal Risk Factors:  Menarche @ age  89  LMP 39 s/p hysterectomy  G2 P2 A0 ; first delivery @ age 79  Denies smoking history  Denies alcohol use  HRT x 1 mo 1990s, stopped  Family History:    Review of Systems:   See HPI for pertinent positives  Gen: denies fevers/chills, fatigue, unintentional weight loss, night sweats  HEENT: Denies dizziness, vision changes, loss of hearing, nosebleeds, dysphagia  CV: denies chest pain, palpitations, shortness of breath, orthopnea, edema  Resp: denies cough, shortness of breath, hemoptysis  Breast: denies erythema, inflammation, nipple discharge  GI: denies abdominal pain, nausea/vomiting, diarrhea/constipation, or bleeding  GU: denies dysuira, hematuria, polyuria  Neuro: denies headache, dizziness, loss of consciousness    PHYSICAL EXAM:  BP 189/71   Pulse 80   Temp 97.7 F (36.5 C) (Temporal)   Resp 16   Ht '4\' 6"'$  (1.372 m)   Wt 58.5 kg (128 lb 15.5 oz)   LMP  (LMP Unknown)   SpO2 98%   BMI 31.10 kg/m    GENERAL APPEARANCE: The patient is an alert, female in no acute distress.  SKIN: 3cm seborrheic keratosis lesion of left lower breast right above inframammary fold.  EENT:  NC,  AT. Juliette Mangle. Sclerae anicteric.  LYMPH NODES: There is no cervical, supraclavicular, infraclavicular, or axillary adenopathy.  CHEST: The lungs are clear to auscultation bilaterally.  HEART: Regular rate and rhythm, no murmurs, rubs, or gallops appreciated.  BREASTS: Right well healed inc breast and axilla with post-radn skin changes and mild erythema along scar, No ln palp. Left breast no masses, no LAD, no skin nor nipple changes.   ABDOMEN:  The abdomen is soft, nondistended.  EXTREMITES: There is no edema or cyanosis.  NEURO:  Mental status is normal.  No focal neurologic findings.      04/05/19, DIAGNOSTIC MAMMOGRAM WITH DIGITAL BREAST TOMOSYNTHESIS FINDINGS:  The breasts are heterogeneously dense, which may obscure small masses.    In the left breast there are no masses, asymmetries, or suspicious calcifications.    There are  stable post-lumpectomy changes in the right breast.  No suspicious findings. Stable post-radiation changes in the right breast.    IMPRESSION / RECOMMENDATION:  There is no evidence of malignancy in the left breast.    Stable post-lumpectomy changes in the right breast is benign.    Normal interval follow-up mammogram in 1 year is recommended.    ASSESSMENT:  BI-RADS Category 2:  Benign Finding(s)      ASSESSMENT/PLAN:  In summary [name] is a 69 year old woman stage 2 IDC, no clinical evidence of recurrence.  1. Continue f/u with [facility]  2. Pt declines OT since she lives far  3. DME order for R arm compression sleeve  4. Mammogram today benign; follow annually, ordered today  5. F/u in 73mo to reassess post-radn skin changes and inflammation    All questions answered.  The patient was seen and examined by Dr. BBenjie Karvonentoday.  Treatment plan established by physician, who was immediately available for today's visit.      JLewis Shock PA-C  [facility]      Future Appointments   Date Time Provider DLindsay  10/04/2019  8:20 AM BRemer Macho MD [facility] KShirl Harris

## 2019-04-05 NOTE — Interdisciplinary (Signed)
69 year old patient with breast cancer here for 1year follow up with Dr Benjie Karvonen    Diagnosis:H/O Rt. Breast [place]               Surgery: RT Breast lumpectomy 05/03/2017  XRT:No  Systemic therapy:    Imaging - pending MMG results    Nursing assessment  Pt seen and assessed by Blanch Media, PA    Wellbeing Assessment :   Transportation issue: None  Barriers to learning: None.  Emotional support given          Interventions:  Supportive care given. Personal difficulties related to treatment addressed to patient's satisfaction. Patient/family concerns addressed.     Education:  Dentist, AVS given and reviewed by [employer]. Questions asked and answered to patient's satisfaction and understanding    Plan:  -Mammo with RV in 1 yea  -Compression sleeve ordered        Marrtii/Language line Spanish  interpreter ID [phone] used to translate appt/call

## 2019-04-05 NOTE — Patient Instructions (Addendum)
[facility]  PHONE LIST FOR PATIENTS    Hours of operation:  Monday-Friday 8:00 - 5:00pm. Closed Holidays and Weekends    AFTER HOURS EMERGENCY NUMBER:    For urgent questions after hours, or on weekends/holidays, contact the [place] paging operator at [phone], and ask to speak to the doctor on call for Dr. Sarah Blair    To make a follow up appointment, please call the call the [facility] front desk at: [phone]    For scheduling issues, insurance or disability questions, please call,  Administrative Assistant Lori Morgan:Phone: [phone], Fax: [phone]     For nursing questions during weekdays, please call:   Chinaza Rooke , RN, BSN, Nurse Case Manager at [phone]    For Medication Refills, allow 3-4 days and fax request to [phone]   Attention: Wandalee Klang, RN    Social Worker: Laurie Knight, BS, MSW, LCSW   Phone: [phone]    [facility] number: [phone]    [facility]: [phone]   **Call to schedule imaging appointments     Information Desk [phone] - General information: directions, telephone numbers, or available services.       We understand that this can be an overwhelming time. Please utilize the following resources to access information and support that can help you and your loved ones.     National Comprehensive Cancer Network resources on Breast Cancer:   https://www.nccn.org/patients/guidelines/cancers.aspx    For [facility] support services http://cancer.[place].edu/outreach/survivorship/support.asp     For other support from the American Cancer Society, please visit: http://www.cancer.org/Treatment/SupportProgramsServices/index     Kaiser's support groups (open to any patients in [place] area:) http://continuingcare-[place].kp.org/Support_Home.html     American Cancer Society Breast [place] support groups:  https://www.cancer.org/treatment/support-programs-and-services.html     Patient and Family Resource Center - Open daily; hours vary   Location: [facility], Room 1066, on the first floor just to the left of the lobby elevators   Phone [phone]     Insurance Contracting: [phone]   MED CENTER Billing: [phone]   MED GROUP (Physician) Billing: [phone]   [name], [facility] Financial Counselor: [phone] [email or web address]

## 2019-04-05 NOTE — Telephone Encounter (Signed)
-----   Message from E[place]ly Shock, [place] sent at 04/05/2019 10:36 AM PST -----  Hi, ordered DME: right compression sleeve for this patient. If you could follow up. Thanks!

## 2019-04-09 NOTE — Telephone Encounter (Signed)
No Authorization required for DME Misc Order: R arm compression sleeve and gauntlet 20-55mmHg from Medicare. Faxed copy of note, order, and facesheet to [facility] contracted provider with Medicare.  They will be reaching out to patient for scheduling.

## 2019-04-23 ENCOUNTER — Encounter (INDEPENDENT_AMBULATORY_CARE_PROVIDER_SITE_OTHER): Payer: Self-pay

## 2019-04-23 ENCOUNTER — Encounter (INDEPENDENT_AMBULATORY_CARE_PROVIDER_SITE_OTHER): Payer: Self-pay | Admitting: Hospital

## 2019-05-01 ENCOUNTER — Encounter (INDEPENDENT_AMBULATORY_CARE_PROVIDER_SITE_OTHER): Payer: Self-pay | Admitting: Hospital

## 2019-10-03 NOTE — Interdisciplinary (Deleted)
27 year oldpatient with breast cancer here for 6 monthfollow up with Dr Benjie Karvonen to reassess post radiation skin changes and inflammmation    Diagnosis:breast cancer tXn1  Surgical Procedure: rt image loc ALND 05/03/2017  Chemotherapy: TC, arimidex  Radiation: radiation     Imaging    Nursing assessment  Pt seen and assessed by Blanch Media, PA    Wellbeing Assessment :   Transportation issue: None  Barriers to learning: None.  Emotional support given         Interventions:  Supportive care given. Personal difficulties related to treatment addressed to patient's satisfaction. Patient/family concerns addressed.     Education:  Dentist, AVS given and reviewed by [employer]. Questions asked and answered to patient's satisfaction and understanding    Plan:

## 2019-10-03 NOTE — Progress Notes (Deleted)
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  She presented with axillary mets.  Unable to find primary tumor in breast.  She has a mass in the Right breast 9:00, N+4cm that measures 2.1cm which attempted biopsy twice at outside facility with benign/ADH findings. Had change on screening mammogram 10/2016, prior mammo 2014. Workup revealed three masses in Right breast and abnormal lymph node. Breast masses all returned benign on biopsy, except LN showed metastatic disease c/w breast primary. Repeat bx in the breast at [place] was also benign. We discussed in these cases of + axillary node with unknown primary there is no difference in survival between mast + ALND vs radn and ALND according to small studies. She wanted BCT and had a lump + ALND for IDC T0N1 3/19 nodes. She completed radiation therapy. She is on Arimidex, with plan to continue AI for 10 years.    ***She returns to clinic accompanied by son [name], for clinical breast check and f/u. She states she is doing well overall. She denies any new lumps/masses, skin changes or nipple discharge. She notes numbness and occasional shooting pain in axillary region, also with mild RUE arm edema. She declines OT since she lives far, however does lymphedema ppx exercises at home. She completed radiation in 10/2018.       Surgical Oncologic History:  Stage at first diagnosis: breast cancer tXn1  Surgical Procedure: rt image loc ALND  Chemotherapy: TC, arimidex  Radiation: radiation       A: Right breast, lumpectomy:  -Mammary tissue with rare focus of atypical ductal hyperplasia in the background of proliferative fibrocystic changes and microcalcification.  -No DCIS or invasive carcinoma identified.  -Multiple biopsy site changes.  -Pathologic stage: pTXN1a.  B: Right axillary  contents, excision:  -Metastatic carcinoma in three of three of nineteen lymph nodes (3/19).  -The largest size of metastatic carcinoma measures 4.3 cm with extranodal extension.  COMMENT:  BREAST CANCER: SYNOPTIC REPORT:  Specimen/Procedure: Excision (less than total mastectomy)  Lymph Node Sampling: Axillary dissection  Specimen Laterality:  Right  Tumor Focality: Not applicable  Invasive Tumor Type: Invasive tumor not identified  Duct Carcinoma In Situ (DCIS): Not identified, rare focus of ADH is seen close to one of the biopsy sites  Lymph Nodes Positive /Total Lymph Nodes Sampled: 3/19  Number of lymph nodes with macrometastases: 3  Number of lymph nodes with micrometastases: 0  Diameter of largest metastasis: 4.3 cm  Extranodal extension and size: present, 1.0 cm  Treatment Effect(s): Response to Presurgical (Neoadjuvant) Therapy: Not applicable.  Her2/neu Status (IHC) : Negative ( score 0, ASCO/CAP 2018)  Her2/neu Status (FISH): Pending on current specimen  Hormone Receptor Status (ER/PR):  Estrogen receptor (ER): Positive (3+, 40%)  Progesterone receptor (PR): Positive (3+, 10%)  Pathologic Stage Classification (pTNM, AJCC 8th Edition):  Primary Tumor (Invasive Carcinoma) (pT):  pTX: Primary tumor cannot be assessed  Regional Lymph Nodes (pN):  pN1a:   Metastases in 1 to 3 axillary lymph nodes, at least 1 metastasis larger than 2.0 mm  Nipple: Not applicable  Skin/dermis: Unremarkable  Skeletal Muscle: Not applicable  Representative block(s) for potential ancillary testing: positive lymph node (B10, B11, 90%)  Additional findings: Proliferative fibrocystic changes, calcific atherosclerosis of  breast vessels, microcalcification, multiple biopsy site changes.    Pathology (see media), 01/19/2017 :  FINAL PATHOLOGIC DIAGNOSIS:  Outside slides received [address], [place], [place], [id number]",  01/19/17)  A: Right axillary lymph node, biopsy:  -Metastatic carcinoma, consistent with breast primary, see  comment.  COMMENT: The tumor involves multiple fragments. A review of the outside  immunohistochemical slides, with their appropriately reactive controls,  showed the tumor to be strongly positive for GATA3 and weakly positive for  CK7, supporting the rendered diagnosis.  The tumor also displayed the following immunoprofile:  Estrogen receptor (ER): Positive (3+, >95%)  Progesterone receptor (PR): Negative  Her2/neu (IHC): Negative (score 0, ASCO/CAP 2018)  SPECIMEN(S) SUBMITTED: A: Outside slide review    Current Medications:  CALCIUM PO,   Carboxymeth-Glycerin-Polysorb (REFRESH OPTIVE ADVANCED) 0.5-1-0.5 % SOLN,   glimepiride (AMARYL) 4 MG tablet, Take 4 mg by mouth every morning.  losartan-hydrochlorothiazide (HYZAAR) 100-25 MG tablet, Take 1 tablet by mouth daily.  metFORMIN (GLUCOPHAGE) 1000 MG tablet, Take 1,000 mg by mouth 2 times daily (with meals).  omeprazole (PRILOSEC) 40 MG capsule, Take 40 mg by mouth daily.  simvastatin (ZOCOR) 10 MG tablet, Take 10 mg by mouth every evening.        Allergies:  Patient is allergic to morphine, phenylephrine, and benadryl [diphenhydramine].    Past Medical History:  Patient  has a past medical history of Breast cancer (CMS-HCC), Diabetes mellitus (CMS-HCC), Hypertension, and Migraine.    Past Surgical History:  The patient  has a past surgical history that includes Cesarean section, classic; Total abdominal hysterectomy w/ bilateral salpingoophorectomy (1999); and Breast lumpectomy (1996).    Social History:  The patient  reports that she has never smoked. She has never used smokeless tobacco. She reports that she does not drink alcohol and does not use drugs.    Family History:  Family History   Adopted: Yes   Family history unknown: Yes       Personal Risk Factors:  Menarche @ age 29  LMP 78 s/p hysterectomy  G2 P2 A0 ; first delivery @ age 89  Denies smoking history  Denies alcohol use  HRT x 1 mo 1990s, stopped  Family History:    Review of Systems:   See HPI for  pertinent positives  Gen: denies fevers/chills, fatigue, unintentional weight loss, night sweats  HEENT: Denies dizziness, vision changes, loss of hearing, nosebleeds, dysphagia  CV: denies chest pain, palpitations, shortness of breath, orthopnea, edema  Resp: denies cough, shortness of breath, hemoptysis  Breast: denies erythema, inflammation, nipple discharge  GI: denies abdominal pain, nausea/vomiting, diarrhea/constipation, or bleeding  GU: denies dysuira, hematuria, polyuria  Neuro: denies headache, dizziness, loss of consciousness    PHYSICAL EXAM:  LMP  (LMP Unknown)    GENERAL APPEARANCE: The patient is an alert, female in no acute distress.  SKIN: 3cm seborrheic keratosis lesion of left lower breast right above inframammary fold.  EENT:  NC, AT. EOMI. Sclerae anicteric.  LYMPH NODES: There is no cervical, supraclavicular, infraclavicular, or axillary adenopathy.  Respiratory: Breathing comfortably, speaking full sentences, O2 sat normal  Cardiovascular: no LE edema  BREASTS: Right well healed inc breast and axilla with post-radn skin changes and mild erythema along scar, No ln palp. Left breast no masses, no LAD, no skin nor nipple changes.   ABDOMEN:  The abdomen is soft, nondistended.  EXTREMITES: There is no edema or cyanosis.  NEURO:  Mental status is normal.  No focal neurologic findings.  Imaging:  04/05/19, DIAGNOSTIC MAMMOGRAM WITH DIGITAL BREAST TOMOSYNTHESIS FINDINGS:  The breasts are heterogeneously dense, which may obscure small masses.    In the left breast there are no masses, asymmetries, or suspicious calcifications.    There are stable post-lumpectomy changes in the right breast.  No suspicious findings. Stable post-radiation changes in the right breast.    IMPRESSION / RECOMMENDATION:  There is no evidence of malignancy in the left breast.    Stable post-lumpectomy changes in the right breast is benign.    Normal interval follow-up mammogram in 1 year is recommended.    ASSESSMENT:   BI-RADS Category 2:  Benign Finding(s)      ASSESSMENT/PLAN:  In summary [name] is a 69 year old woman stage 2 IDC, no clinical evidence of recurrence.  1. Continue f/u with Medonc  2. Mammogram 03/2019 benign; F/u in 85mo with imaging

## 2019-10-03 NOTE — Patient Instructions (Signed)
[facility]  PHONE LIST FOR PATIENTS    Hours of operation:  Monday-Friday 8:00 - 5:00pm. Closed Holidays and Weekends    AFTER HOURS EMERGENCY NUMBER:    For urgent questions after hours, or on weekends/holidays, contact the [place] paging operator at [phone], and ask to speak to the doctor on call for Dr. Sarah Blair    To make a follow up appointment, please call the call the [facility] front desk at: [phone]    For scheduling issues, insurance or disability questions, please call,  Administrative Assistant Lori Morgan:Phone: [phone], Fax: [phone]    For nursing questions during weekdays, please call:   Jaielle Dlouhy , RN, BSN, Nurse Case Manager at [phone]    For Medication Refills, allow 3-4 days and fax request to [phone]   Attention: Tallia Moehring, RN    Social Worker: Laurie Knight, BS, MSW, LCSW   Phone: [phone]    [facility] number: [phone]    [facility]: [phone]   **Call to schedule imaging appointments     Information Desk [phone] - General information: directions, telephone numbers, or available services.       We understand that this can be an overwhelming time. Please utilize the following resources to access information and support that can help you and your loved ones.     National Comprehensive Cancer Network resources on Breast Cancer:   https://www.nccn.org/patients/guidelines/cancers.aspx    For [facility] support services http://cancer.[place].edu/outreach/survivorship/support.asp     For other support from the American Cancer Society, please visit: http://www.cancer.org/Treatment/SupportProgramsServices/index     Kaiser's support groups (open to any patients in [place] area:) http://continuingcare-[place].kp.org/Support_Home.html     American Cancer Society Breast [place] support groups:  https://www.cancer.org/treatment/support-programs-and-services.html     Patient and Family Resource Center - Open daily; hours vary   Location: [facility], Room 1066, on the first floor just to the left of the lobby elevators   Phone [phone]     Insurance Contracting: [phone]   MED CENTER Billing: [phone]   MED GROUP (Physician) Billing: [phone]   Marisela, [facility] Financial Counselor: [phone] [email or web address]

## 2019-10-04 ENCOUNTER — Encounter (HOSPITAL_BASED_OUTPATIENT_CLINIC_OR_DEPARTMENT_OTHER): Payer: Self-pay

## 2019-10-04 ENCOUNTER — Ambulatory Visit (HOSPITAL_BASED_OUTPATIENT_CLINIC_OR_DEPARTMENT_OTHER): Payer: Medicare Other | Admitting: Surgical Oncology

## 2019-10-04 DIAGNOSIS — C50911 Malignant neoplasm of unspecified site of right female breast: Secondary | ICD-10-CM

## 2019-10-31 ENCOUNTER — Encounter (HOSPITAL_BASED_OUTPATIENT_CLINIC_OR_DEPARTMENT_OTHER): Payer: Self-pay | Admitting: Hospital

## 2019-11-22 ENCOUNTER — Telehealth (HOSPITAL_BASED_OUTPATIENT_CLINIC_OR_DEPARTMENT_OTHER): Payer: Self-pay | Admitting: Surgical Oncology

## 2019-11-22 NOTE — Telephone Encounter (Signed)
[facility] Call [place] Message for MD/RN:    Incoming call received from: Alma     Provider: Dr. Benjie Karvonen    Reason for call: Daughter Dedra Skeens called in regards to scheduling a F/U appt. Since pt missed OV on 7/22. Per Daughter pt usually has a mammogram same day as OV, and states we contact radiology to set it up. Per daughter would like to schedule appt. For the last week of this month, 9/27. Please follow up. Thank you.     Return call requested: Yes     Best callback number: [phone]    [facility] to leave a detailed message: Yes     Inform Caller:    The timeframe for return calls is typically within 24 hours or before the end of the business day.

## 2019-11-23 NOTE — Telephone Encounter (Signed)
Spoke with pt daughter Dedra Skeens.  Return visit with Dr Benjie Karvonen scheduled for 12/17/19 at 10:00 AM.  Informed her that mammogram not due til January 2022.

## 2019-12-13 ENCOUNTER — Ambulatory Visit (HOSPITAL_BASED_OUTPATIENT_CLINIC_OR_DEPARTMENT_OTHER): Payer: Medicare Other

## 2019-12-16 NOTE — Patient Instructions (Signed)
[facility]  PHONE LIST FOR PATIENTS    Hours of operation:  Monday-Friday 8:00 - 5:00pm. Closed Holidays and Weekends    AFTER HOURS EMERGENCY NUMBER:    For urgent questions after hours, or on weekends/holidays, contact the [place] paging operator at [phone], and ask to speak to the doctor on call for Dr. Sarah Blair    To make a follow up appointment, please call the call the [facility] front desk at: [phone]    For scheduling issues, insurance or disability questions, please call,  Administrative Assistant Lori Morgan:Phone: [phone], Fax: [phone]    For nursing questions during weekdays, please call:   Roniqua Kintz , RN, BSN, Nurse Case Manager at [phone]    For Medication Refills, allow 3-4 days and fax request to [phone]   Attention: Earline Stiner, RN    Social Worker: Laurie Knight, BS, MSW, LCSW   Phone: [phone]    [facility] number: [phone]    [facility]: [phone]   **Call to schedule imaging appointments     Information Desk [phone] - General information: directions, telephone numbers, or available services.       We understand that this can be an overwhelming time. Please utilize the following resources to access information and support that can help you and your loved ones.     National Comprehensive Cancer Network resources on Breast Cancer:   https://www.nccn.org/patients/guidelines/cancers.aspx    For [facility] support services http://cancer.[place].edu/outreach/survivorship/support.asp     For other support from the American Cancer Society, please visit: http://www.cancer.org/Treatment/SupportProgramsServices/index     Kaiser's support groups (open to any patients in [place] area:) http://continuingcare-[place].kp.org/Support_Home.html     American Cancer Society Breast [place] support groups:  https://www.cancer.org/treatment/support-programs-and-services.html     Patient and Family Resource Center - Open daily; hours vary   Location: [facility], Room 1066, on the first floor just to the left of the lobby elevators   Phone [phone]     Insurance Contracting: [phone]   MED CENTER Billing: [phone]   MED GROUP (Physician) Billing: [phone]   Marisela, M[facility] Financial Counselor: [phone] [email or web address]

## 2019-12-16 NOTE — Interdisciplinary (Signed)
69 year old patient with breast cancer here for 6 month  follow up with Dr Benjie Karvonen for reassessment of post-radn skin changes and inflammation    Diagnosis: breast cancer tXn1  Surgical Procedure: rt image loc ALND 05/03/2017  Chemotherapy: TC, arimidex  Radiation: radiation      Nursing assessment  Pt unaccompanied by daughter Alma  Alert and oriented x 4  Denies breast related complaints- pt asymptomatic of elevated BP, did take her BP meds this am    Wellbeing Assessment :   Transportation issue: None  Barriers to learning: None.  Emotional support given       Interventions:  Supportive care given. Personal difficulties related to treatment addressed to patient's satisfaction. Patient/family concerns addressed.     Education:  Contact information, AVS given and reviewed. Questions asked and answered to patient's satisfaction and understanding    Plan:  -Mammo with RV in 3 months 03/2020  -OT ordered  - f/u PCP for elevated BP    [place]/Language line Spanish  interpreter mateo ID [phone] used to translate appt/call

## 2019-12-17 ENCOUNTER — Encounter (HOSPITAL_BASED_OUTPATIENT_CLINIC_OR_DEPARTMENT_OTHER): Payer: Self-pay | Admitting: Surgical Oncology

## 2019-12-17 ENCOUNTER — Ambulatory Visit: Payer: Medicare Other | Attending: Surgical Oncology | Admitting: Surgical Oncology

## 2019-12-17 ENCOUNTER — Encounter (HOSPITAL_COMMUNITY): Payer: Self-pay

## 2019-12-17 VITALS — BP 174/71 | HR 75 | Temp 98.4°F | Resp 16 | Ht <= 58 in | Wt 123.7 lb

## 2019-12-17 DIAGNOSIS — C50811 Malignant neoplasm of overlapping sites of right female breast: Secondary | ICD-10-CM

## 2019-12-17 DIAGNOSIS — C50919 Malignant neoplasm of unspecified site of unspecified female breast: Secondary | ICD-10-CM | POA: Insufficient documentation

## 2019-12-17 DIAGNOSIS — C773 Secondary and unspecified malignant neoplasm of axilla and upper limb lymph nodes: Secondary | ICD-10-CM

## 2019-12-17 NOTE — Progress Notes (Signed)
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  She presented with axillary mets.  Unable to find primary tumor in breast. . She has a mass in the Right breast 9:00, N+4cm that measures 2.1cm which attempted biopsy twice at outside facility with benign/ADH findings. Had change on screening mammogram 10/2016, prior mammo 2014. Workup revealed three masses in Right breast and abnormal lymph node. Breast masses all returned benign on biopsy, except LN shows metastatic disease c/w breast primary. Repeat bx in the breast at [facility] was also benign.  We discussed in these cases of + axillary node with unknown primary there is no difference in survival between mast + ALND vs radn and ALND according to small studies.  She wants BCT so I recommended we remove that area with atypia + ALND.  She had a lump + ALND for IDC T0N1 3/19 nodes.  Arm edema resolved .  She has left throat soreness resolves with drinking water.    Surgical Oncologic History:  Stage at first diagnosis: breast cancer tXn1  Surgical Procedure: lt image loc alnd  Chemotherapy: TC, arimedex  Radiation: radiation      ROS: Denies fevers, chills, unintentional wt loss, new HA, vision change, CP, cough, SOB, N/V, hematuria, dysuria, new masses/lumps, nipple d/c, extremity swelling, bone pain.      A: Right breast, lumpectomy:  -Mammary tissue with rare focus of atypical ductal hyperplasia in the  background of proliferative fibrocystic changes and microcalcification.  -No DCIS or invasive carcinoma identified.  -Multiple biopsy site changes.  -Pathologic stage: pTXN1a.  B: Right axillary contents, excision:  -Metastatic carcinoma in three of three of nineteen lymph nodes (3/19).  -The largest size of metastatic  carcinoma measures 4.3 cm with extranodal  extension.  COMMENT:  BREAST CANCER: SYNOPTIC REPORT:  Specimen/Procedure: Excision (less than total mastectomy)  Lymph Node Sampling: Axillary dissection  Specimen Laterality:  Right  Tumor Focality: Not applicable  Invasive Tumor Type: Invasive tumor not identified  Duct Carcinoma In Situ (DCIS): Not identified, rare focus of ADH is seen  close to one of the biopsy sites  Lymph Nodes Positive /Total Lymph Nodes Sampled: 3/19  Number of lymph nodes with macrometastases: 3  Number of lymph nodes with micrometastases: 0  Diameter of largest metastasis: 4.3 cm  Extranodal extension and size: present, 1.0 cm  Treatment Effect(s): Response to Presurgical (Neoadjuvant) Therapy: Not  applicable.  Her2/neu Status (IHC) : Negative ( score 0, ASCO/CAP 2018)  Her2/neu Status (FISH): Pending on current specimen  Hormone Receptor Status (ER/PR):  Estrogen receptor (ER): Positive (3+, 40%)  Progesterone receptor (PR): Positive (3+, 10%)  Pathologic Stage Classification (pTNM, AJCC 8th Edition):  Primary Tumor (Invasive Carcinoma) (pT):  pTX: Primary tumor cannot be assessed  Regional Lymph Nodes (pN):  pN1a:   Metastases in 1 to 3 axillary lymph nodes, at least 1 metastasis  larger than 2.0 mm  Nipple: Not applicable  Skin/dermis: Unremarkable  Skeletal Muscle: Not applicable  Representative block(s) for potential ancillary testing: positive lymph  node (B10, B11, 90%)  Additional findings: Proliferative fibrocystic changes, calcific  atherosclerosis of breast vessels, microcalcification, multiple  biopsy site  changes.    Pathology (see media), 01/19/2017 :  FINAL PATHOLOGIC DIAGNOSIS:  Outside slides received [address] [place], [id number]  01/19/17)  A: Right axillary lymph node, biopsy:  -Metastatic carcinoma, consistent with breast primary, see comment.  COMMENT: The tumor involves multiple fragments. A review of the outside  immunohistochemical slides, with  their appropriately reactive controls,  showed the tumor to be strongly positive for GATA3 and weakly positive for  CK7, supporting the rendered diagnosis.  The tumor also displayed the following immunoprofile:  Estrogen receptor (ER): Positive (3+, >95%)  Progesterone receptor (PR): Negative  Her2/neu (IHC): Negative (score 0, ASCO/CAP 2018)  SPECIMEN(S) SUBMITTED: A: Outside slide review  CLINICAL HISTORY: 69 year old female with no clinical history. Please  review outside slides.  GROSS DESCRIPTION: A: Received for review from [facility], [place],  [place], are eight slides labeled "[id number]" representing right axillary lymph  node biopsy from 01/19/17. Also received is a copy of the surgical  pathology report corresponding to the numbers on the slides. [name]  CONFIDENTIAL HEALTH INFORMATION: Health Care information is personal and  sensitive information. If it is being faxed to you it is done so under  appropriate authorization from the patient or under circumstances that do  not require patient authorization. You, the recipient, are obligated to  maintain it in a safe, secure and confidential manner. Re-disclosure  without additional patient consent or as permitted by law is prohibited.  Unauthorized re-disclosure or failure to maintain confidentiality could  subject you to penalties described in federal and state law. If you have  received this report or facsimile in error, please notify the [facility] immediately and destroy the received document(s).  cc: [facility]    Department of Pathology    [address]    [place], [place] [address]    Phone: [phone]  Final Diagnosis performed by Ronnell Guadalajara, M.D. [phone]) Electronically  signed 03/25/17 8:52  Interpretation performed at [facility],  [place], [place], [place], [address] Electronic Signature derived  from a single controlled access password.    Current Medications:  CALCIUM PO,    Carboxymeth-Glycerin-Polysorb (REFRESH OPTIVE ADVANCED) 0.5-1-0.5 % SOLN,   glimepiride (AMARYL) 4 MG tablet, Take 4 mg by mouth every morning.  losartan-hydrochlorothiazide (HYZAAR) 100-25 MG tablet, Take 1 tablet by mouth daily.  metFORMIN (GLUCOPHAGE) 1000 MG tablet, Take 1,000 mg by mouth 2 times daily (with meals).  omeprazole (PRILOSEC) 40 MG capsule, Take 40 mg by mouth daily.  simvastatin (ZOCOR) 10 MG tablet, Take 10 mg by mouth every evening.        Allergies:  Patient is allergic to morphine, phenylephrine, and benadryl [diphenhydramine].    Past Medical History:  Patient  has a past medical history of Breast cancer (CMS-HCC), Diabetes mellitus (CMS-HCC), Hypertension, and Migraine.    Past Surgical History:  The patient's  has a past surgical history that includes Cesarean section, classic; Total abdominal hysterectomy w/ bilateral salpingoophorectomy (1999); and Breast lumpectomy (1996).    Social History:  The patient's  reports that she has never smoked. She has never used smokeless tobacco. She reports that she does not drink alcohol and does not use drugs.    Family History:  Family History   Adopted: Yes   Family history unknown: Yes       Personal Risk Factors:  Menarche @ age 62  LMP 18 s/p hysterectomy  G2 P2 A0  ; first delivery @  age 47  Denies smoking history  Denies alcohol use  HRT x 1 mo 1990s, stopped  Family History:      Review of Systems:   See HPI for pertinent positives  Gen: denies fevers/chills, fatigue, unintentional weight loss, night sweats  HEENT: Denies dizziness, vision changes, loss of hearing, nosebleeds, dysphagia  CV: denies chest pain, palpitations, shortness of breath, orthopnea, edema  Resp: denies cough, shortness of breath, hemoptysis  Breast: denies erythema, inflammation, nipple discharge  GI: denies abdominal pain, nausea/vomiting, diarrhea/constipation, or bleeding  GU: denies dysuira, hematuria, polyuria  Neuro: denies headache, dizziness, loss of  consciousness    PHYSICAL EXAM:  BP 174/71    Pulse 75    Temp 98.4 F (36.9 C) (Temporal)    Resp 16    Ht $R'4\' 6"'Lx$  (1.372 m)    Wt 56.1 kg (123 lb 10.9 oz)    LMP  (LMP Unknown)    SpO2 98%    BMI 29.82 kg/m    GENERAL APPEARANCE: The patient is an alert, female in no acute distress.  SKIN: Without visible lesion  EAR, NOSE, & THROAT:  NC, AT. EOMI. Oropharynx is clear.  LYMPH NODES: There is no cervical, supraclavicular, infraclavicular, or axillary adenopathy.  CHEST: The lungs are clear to auscultation bilaterally.  HEART: Regular rate and rhythm, no murmurs, rubs, or gallops appreciated.  BREASTS: Right well healed inc breast and axilla no masses no ln  No ln palp Left breast no masses, no LAD, no skin nor nipple changes.   ABDOMEN:  The abdomen is soft, nondistended.  EXTREMITES: There is no edema or cyanosis.rt shoulder ROM 160  NEURO:  Mental status is normal.  No focal neurologic findings.    DIAGNOSTIC MAMMOGRAM WITH DIGITAL BREAST TOMOSYNTHESIS FINDINGS:  The breasts are heterogeneously dense, which may obscure small masses.    In the left breast there are no masses, asymmetries, or suspicious calcifications.    There is post-lumpectomy changes and post-radiation changes in the right breast.  No suspicious findings.    IMPRESSION / RECOMMENDATION:  There is no evidence of malignancy in the left breast.    Post-lumpectomy changes and post-radiation changes in the right breast are benign.    Normal interval follow-up mammogram in 1 year is recommended.    ASSESSMENT:  BI-RADS Category 2:  Benign Finding(s)    ASSESSMENT/PLAN:  In summary Alexzandrea Normington is a 69 year old woman stage 2 IDC no evidence of recurrence  1. OT decrease ROM  2. F/u 45mo after mammogram  3. F/u PCP lt throat discomfort

## 2020-02-06 ENCOUNTER — Encounter (HOSPITAL_BASED_OUTPATIENT_CLINIC_OR_DEPARTMENT_OTHER): Payer: Self-pay

## 2020-02-06 NOTE — Progress Notes (Signed)
pts OT Initial Eval from [facility] received and signed by Dr Benjie Karvonen. Signed eval faxed back to [facility]

## 2020-03-24 ENCOUNTER — Ambulatory Visit (HOSPITAL_BASED_OUTPATIENT_CLINIC_OR_DEPARTMENT_OTHER): Payer: Medicare Other | Admitting: Surgical Oncology

## 2020-03-24 ENCOUNTER — Encounter (HOSPITAL_BASED_OUTPATIENT_CLINIC_OR_DEPARTMENT_OTHER): Payer: Self-pay

## 2020-06-29 NOTE — Interdisciplinary (Signed)
70 year old patient with breast cancer here for 6 month follow up with Dr Benjie Karvonen    Surgical Oncologic History:  Diagnosis: rt breast IDC ER + PR - HER2 - tXn1  Surgical Procedure: lt image loc alnd 05/03/2017  Chemotherapy: TC, arimedex (Dr Davy Pique)  Radiation: radiation 2019    Imaging  06/30/2020 Dx Mammo  DIAGNOSTIC MAMMOGRAM WITH DIGITAL BREAST TOMOSYNTHESIS FINDINGS:  The breasts are heterogeneously dense, which may obscure small masses.    In the left breast there are no masses, asymmetries, or suspicious calcifications.    There are stable post-lumpectomy changes in the right breast.  No suspicious findings.    IMPRESSION / RECOMMENDATION:  There is no evidence of malignancy in the left breast.    Stable post-lumpectomy changes in the right breast is benign.    Normal interval follow-up mammogram in 1 year is recommended.    ASSESSMENT:  BI-RADS Category 2:  Benign Finding(s)    Nursing assessment  Pt accompanied by daughter Dedra Skeens  Alert and oriented x 4  Denies breast related complaints    Wellbeing Assessment :   Transportation issue: None  Barriers to learning: None.  Emotional support given       Interventions:  Supportive care given. Personal difficulties related to treatment addressed to patient's satisfaction. Patient/family concerns addressed.     Education:  Contact information, AVS given and reviewed. Questions asked and answered to patient's satisfaction and understanding    Plan:  - cont f/u with med onc Dr Davy Pique w/ yearly mammo  - RV prn     Interpreter Services  Language: Spanish  Device: Video/Martti  Interpreter #: [id number]      Wellbeing V2    Pre-Intake       Screening  [facility] V2     Question 06/30/2020  9:45 AM PDT - Danley Danker by Patient    Would you like to complete the Wellbeing Screening at this time? / Desea completar la evaluacin de bienestar en este momento? Yes    Who is filling out this form? / Quin esta llenando este formulario? Someone is writing down the answers I  gave. / Alguien est escribiendo las [employer].    1. In general, would you say your health is: / En general, dira que su salud es: Good/Bien    2. In general, would you say your quality of life is: / En general, dira que su calidad de vida es: Very Good/Muy bien    3. In general, how would you rate your physical health? / En general, cmo calificara su salud fsica? Good/Bien    4. In general, how would you rate your mental health, including your mood and your ability to think? / En general, cmo calificara su salud mental, incluyendo su estado de nimo y su capacidad para pensar? Good/Bien    5. In general, how would you rate your satisfaction with your social activities and relationships? / En general, cmo calificara su satisfaccin con sus actividades sociales y sus relaciones con [place]? Very Good/Muy bien    6. To what extent are you able to carry out your everyday physical activities such as walking, climbing stairs, carrying groceries, or moving a chair? / En qu medida puede realizar sus actividades fsicas diarias, como caminar, subir escaleras, cargar las compras o mover una silla? Mostly/En su mayora    7. In general, please rate how well you carry out your usual social activities and roles. (This includes activities at  home, at work and in Char[employer] fundraiser, and responsibilities as a parent, child, spouse, employee, friend, etc.) / En general, califique en qu medida puede realizar sus actividades sociales y funciones habituales. (Esto incluye las actividades en casa, en el trabajo y en el rea donde reside, as como sus responsabilidades como padre o madre, hijo/a, cnyuge, empleado/a, amigo/a, etc.)  Very Good/Muy bien    In the past 7 days / En los ultimos 7 dias     8. How would you rate your pain on average? / En promedio, cmo calificara su dolor? 4    9. How would you rate your fatigue on average? / En promedio, cmo calificara su cansancio? Mild/Leve    10. How often  have you been bothered by emotional problems such as feeling anxious, depressed or irritable? / Con qu frecuencia le han afectado problemas emocionales como sentir ansiedad, depresin o irritabilidad?  Sometimes/Algunas veces    11. Do you have concerns about your fertility? / Tiene preocupaciones acerca de su fertilidad? No    13. Would you like to speak with anyone about your spiritual needs? / Deseara hablar con alguien acerca de sus necesidades espirituales? No    PROMIS Adult Short Form-Global Health Score (Physical) (range: 16 - 68) 44.9    PROMIS Adult Short Form-Global Health Score (Mental) (range: 21 - 68) 48.3

## 2020-06-29 NOTE — Patient Instructions (Signed)
[facility]  PHONE LIST FOR PATIENTS    Hours of operation:  Monday-Friday 8:00 - 5:00pm. Closed Holidays and Weekends    AFTER HOURS EMERGENCY NUMBER:    For urgent questions after hours, or on weekends/holidays, contact the [facility] paging operator at [phone], and ask to speak to the doctor on call for Dr. Sarah Blair    To make a follow up appointment, please call the call the [facility] front desk at: [phone]    For scheduling issues, insurance or disability questions, please call,  Administrative Assistant Lori Morgan:Phone: [phone], Fax: [phone]    For nursing questions during weekdays, please call:   Gertie Broerman , RN, BSN, Nurse Case Manager at [phone]    For Medication Refills, allow 3-4 days and fax request to [phone]   Attention: Chayanne Speir, RN    Social Worker: Laurie Knight, BS, MSW, LCSW   Phone: [phone]    [facility] number: [phone]    [facility]: [phone]   **Call to schedule imaging appointments     Information Desk [phone] - General information: directions, telephone numbers, or available services.       We understand that this can be an overwhelming time. Please utilize the following resources to access information and support that can help you and your loved ones.     National Comprehensive Cancer Network resources on Breast Cancer:   https://www.nccn.org/patients/guidelines/cancers.aspx    For [facility] support services http://cancer..edu/outreach/survivorship/support.asp     For other support from the American Cancer Society, please visit: http://www.cancer.org/Treatment/SupportProgramsServices/index     [facility] support groups (open to any patients in [place] area:) http://continuingcare-[place].kp.org/Support_Home.html     American Cancer Society Breast [place] support groups:  https://www.cancer.org/treatment/support-programs-and-services.html     [facility] - Open daily; hours vary   Location: [facility], Room 1066, on the first floor just to the left of the lobby elevators   Phone [phone]     Insurance Contracting: [phone]   MED CENTER Billing: [phone]   MED GROUP (Physician) Billing: [phone]   Marisela, [facility] Financial Counselor: [phone] [email or web address]

## 2020-06-30 ENCOUNTER — Ambulatory Visit
Admission: RE | Admit: 2020-06-30 | Discharge: 2020-06-30 | Disposition: A | Discharge status: Home / Self Care | Payer: Medicare Other | Attending: Surgical Oncology | Admitting: Surgical Oncology

## 2020-06-30 ENCOUNTER — Encounter (HOSPITAL_BASED_OUTPATIENT_CLINIC_OR_DEPARTMENT_OTHER): Payer: Self-pay | Admitting: Surgical Oncology

## 2020-06-30 ENCOUNTER — Ambulatory Visit (HOSPITAL_BASED_OUTPATIENT_CLINIC_OR_DEPARTMENT_OTHER): Payer: Medicare Other | Admitting: Surgical Oncology

## 2020-06-30 ENCOUNTER — Other Ambulatory Visit: Payer: Self-pay

## 2020-06-30 VITALS — BP 166/64 | HR 79 | Temp 97.5°F | Resp 16 | Ht <= 58 in | Wt 123.5 lb

## 2020-06-30 DIAGNOSIS — C773 Secondary and unspecified malignant neoplasm of axilla and upper limb lymph nodes: Secondary | ICD-10-CM

## 2020-06-30 DIAGNOSIS — Z170 Estrogen receptor positive status [ER+]: Secondary | ICD-10-CM

## 2020-06-30 DIAGNOSIS — Z9889 Other specified postprocedural states: Secondary | ICD-10-CM

## 2020-06-30 DIAGNOSIS — C50919 Malignant neoplasm of unspecified site of unspecified female breast: Secondary | ICD-10-CM

## 2020-06-30 DIAGNOSIS — C801 Malignant (primary) neoplasm, unspecified: Secondary | ICD-10-CM

## 2020-06-30 DIAGNOSIS — C50811 Malignant neoplasm of overlapping sites of right female breast: Secondary | ICD-10-CM

## 2020-06-30 MED ORDER — OMEPRAZOLE 40 MG OR CPDR
1.00 | DELAYED_RELEASE_CAPSULE | ORAL | Status: AC
Start: 2020-06-24 — End: ?

## 2020-06-30 MED ORDER — DICLOFENAC SODIUM 1 % EX GEL
Freq: Three times a day (TID) | CUTANEOUS | Status: AC
Start: 2020-06-24 — End: ?

## 2020-06-30 MED ORDER — METFORMIN HCL 1000 MG OR TABS
ORAL_TABLET | Freq: Two times a day (BID) | ORAL | Status: DC
Start: 2020-06-24 — End: 2020-06-30

## 2020-06-30 MED ORDER — ANASTROZOLE 1 MG OR TABS
ORAL_TABLET | ORAL | Status: AC
Start: 2020-06-24 — End: ?

## 2020-06-30 MED ORDER — LOSARTAN POTASSIUM 50 MG OR TABS
50.00 mg | ORAL_TABLET | Freq: Every day | ORAL | Status: AC
Start: 2020-04-21 — End: ?

## 2020-06-30 NOTE — Progress Notes (Signed)
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  She presented with axillary mets.  Unable to find primary tumor in breast. . She has a mass in the Right breast 9:00, N+4cm that measures 2.1cm which attempted biopsy twice at outside facility with benign/ADH findings. Had change on screening mammogram 10/2016, prior mammo 2014. Workup revealed three masses in Right breast and abnormal lymph node. Breast masses all returned benign on biopsy, except LN shows metastatic disease c/w breast primary. Repeat bx in the breast at [facility] was also benign.  We discussed in these cases of + axillary node with unknown primary there is no difference in survival between mast + ALND vs radn and ALND according to small studies.  She wants BCT so I recommended we remove that area with atypia + ALND.  She had a lump + ALND for IDC T0N1 3/19 nodes.  Arm edema resolved with good ROM      Surgical Oncologic History:  Stage at first diagnosis: breast cancer tXn1, 2/19  Surgical Procedure: lt image loc alnd  Chemotherapy: TC, arimedex  Radiation: radiation      ROS: Denies fevers, chills, unintentional wt loss, new HA, vision change, CP, cough, SOB, N/V, hematuria, dysuria, new masses/lumps, nipple d/c, extremity swelling, bone pain.      A: Right breast, lumpectomy:  -Mammary tissue with rare focus of atypical ductal hyperplasia in the  background of proliferative fibrocystic changes and microcalcification.  -No DCIS or invasive carcinoma identified.  -Multiple biopsy site changes.  -Pathologic stage: pTXN1a.  B: Right axillary contents, excision:  -Metastatic carcinoma in three of three of nineteen lymph nodes (3/19).  -The largest size of metastatic carcinoma measures 4.3 cm with  extranodal  extension.  COMMENT:  BREAST CANCER: SYNOPTIC REPORT:  Specimen/Procedure: Excision (less than total mastectomy)  Lymph Node Sampling: Axillary dissection  Specimen Laterality:  Right  Tumor Focality: Not applicable  Invasive Tumor Type: Invasive tumor not identified  Duct Carcinoma In Situ (DCIS): Not identified, rare focus of ADH is seen  close to one of the biopsy sites  Lymph Nodes Positive /Total Lymph Nodes Sampled: 3/19  Number of lymph nodes with macrometastases: 3  Number of lymph nodes with micrometastases: 0  Diameter of largest metastasis: 4.3 cm  Extranodal extension and size: present, 1.0 cm  Treatment Effect(s): Response to Presurgical (Neoadjuvant) Therapy: Not  applicable.  Her2/neu Status (IHC) : Negative ( score 0, ASCO/CAP 2018)  Her2/neu Status (FISH): Pending on current specimen  Hormone Receptor Status (ER/PR):  Estrogen receptor (ER): Positive (3+, 40%)  Progesterone receptor (PR): Positive (3+, 10%)  Pathologic Stage Classification (pTNM, AJCC 8th Edition):  Primary Tumor (Invasive Carcinoma) (pT):  pTX: Primary tumor cannot be assessed  Regional Lymph Nodes (pN):  pN1a:   Metastases in 1 to 3 axillary lymph nodes, at least 1 metastasis  larger than 2.0 mm  Nipple: Not applicable  Skin/dermis: Unremarkable  Skeletal Muscle: Not applicable  Representative block(s) for potential ancillary testing: positive lymph  node (B10, B11, 90%)  Additional findings: Proliferative fibrocystic changes, calcific  atherosclerosis of breast vessels, microcalcification, multiple biopsy site  changes.  Pathology (see media), 01/19/2017 :  FINAL PATHOLOGIC DIAGNOSIS:  Outside slides received [address], [place], [id number]",  01/19/17)  A: Right axillary lymph node, biopsy:  -Metastatic carcinoma, consistent with breast primary, see comment.  COMMENT: The tumor involves multiple fragments. A review of the outside  immunohistochemical slides, with their appropriately reactive  controls,  showed the tumor to be strongly positive for GATA3 and weakly positive for  CK7, supporting the rendered diagnosis.  The tumor also displayed the following immunoprofile:  Estrogen receptor (ER): Positive (3+, >95%)  Progesterone receptor (PR): Negative  Her2/neu (IHC): Negative (score 0, ASCO/CAP 2018)      Current Medications:  CALCIUM PO,   Carboxymeth-Glycerin-Polysorb (REFRESH OPTIVE ADVANCED) 0.5-1-0.5 % SOLN,   glimepiride (AMARYL) 4 MG tablet, Take 4 mg by mouth every morning.  losartan-hydrochlorothiazide (HYZAAR) 100-25 MG tablet, Take 1 tablet by mouth daily.  metFORMIN (GLUCOPHAGE) 1000 MG tablet, Take 1,000 mg by mouth 2 times daily (with meals).  omeprazole (PRILOSEC) 40 MG capsule, Take 40 mg by mouth daily.  simvastatin (ZOCOR) 10 MG tablet, Take 10 mg by mouth every evening.        Allergies:  Patient is allergic to morphine, phenylephrine, and benadryl [diphenhydramine].    Past Medical History:  Patient  has a past medical history of Breast cancer (CMS-HCC), Diabetes mellitus (CMS-HCC), Hypertension, and Migraine.    Past Surgical History:  The patient's  has a past surgical history that includes Cesarean section, classic; Total abdominal hysterectomy w/ bilateral salpingoophorectomy (1999); and Breast lumpectomy (1996).    Social History:  The patient's  reports that she has never smoked. She has never used smokeless tobacco. She reports that she does not drink alcohol and does not use drugs.    Family History:  Family History   Adopted: Yes   Family history unknown: Yes       Personal Risk Factors:  Menarche @ age 89  LMP 31 s/p hysterectomy  G2 P2 A0  ; first delivery @ age 8  Denies smoking history  Denies alcohol use  HRT x 1 mo 1990s, stopped  Family History:      Review of Systems:   See HPI for pertinent positives  Gen: denies fevers/chills, fatigue, unintentional weight loss, night sweats  HEENT: Denies dizziness, vision changes, loss of hearing, nosebleeds, dysphagia  CV:  denies chest pain, palpitations, shortness of breath, orthopnea, edema  Resp: denies cough, shortness of breath, hemoptysis  Breast: denies erythema, inflammation, nipple discharge  GI: denies abdominal pain, nausea/vomiting, diarrhea/constipation, or bleeding  GU: denies dysuira, hematuria, polyuria  Neuro: denies headache, dizziness, loss of consciousness    PHYSICAL EXAM:  BP 166/64 (BP Location: Left arm, BP Patient Position: Sitting, BP cuff size: Regular)    Pulse 79    Temp 97.5 F (36.4 C) (Temporal)    Resp 16    Ht $R'4\' 6"'tU$  (1.372 m)    Wt 56 kg (123 lb 8 oz)    LMP  (LMP Unknown)    SpO2 97%    BMI 29.78 kg/m    GENERAL APPEARANCE: The patient is an alert, female in no acute distress.  SKIN: Without visible lesion  EAR, NOSE, & THROAT:  NC, AT. EOMI. Oropharynx is clear.  LYMPH NODES: There is no cervical, supraclavicular, infraclavicular, or axillary adenopathy.  CHEST: The lungs are clear to auscultation bilaterally.  HEART: Regular rate and rhythm, no murmurs, rubs, or gallops appreciated.  BREASTS: Right well healed inc breast and axilla no  masses no ln  No ln palp Left breast no masses, no LAD, no skin nor nipple changes.   ABDOMEN:  The abdomen is soft, nondistended.  EXTREMITES: There is no edema or cyanosis.rt shoulder ROM 160  NEURO:  Mental status is normal.  No focal neurologic findings.    DIAGNOSTIC MAMMOGRAM WITH DIGITAL BREAST TOMOSYNTHESIS FINDINGS:  The breasts are heterogeneously dense, which may obscure small masses.    In the left breast there are no masses, asymmetries, or suspicious calcifications.    There are stable post-lumpectomy changes in the right breast.  No suspicious findings.    IMPRESSION / RECOMMENDATION:  There is no evidence of malignancy in the left breast.    Stable post-lumpectomy changes in the right breast is benign.    Normal interval follow-up mammogram in 1 year is recommended.    ASSESSMENT:  BI-RADS Category 2:  Benign Finding(s)    ASSESSMENT/PLAN:  In  summary [name] is a 70 year old woman stage 2 IDC no evidence of recurrence  1. F/u medical oncology Dr. Davy Pique cont annual mammogram in [facility]

## 2022-04-07 IMAGING — MR RM - COLUNA TOTAL+BACIA
8 of 32 series · 8 of 48 positions shown · non-contrast
Comparison: none

------------- REPORT [id number] -------------
CERVICAL SPINE MRI
TECHNIQUE: FSE (fast-spin-echo) sequences, T1 and T2 weighted, were performed in the sagittal plane and T2 weighted sequences in the axial plane.
TECHNIQUE: Examination performed on magnetic resonance imaging equipment with sequences, weightings, and specific planes for the segment of interest, without intravenous administration of contrast medium.
TECHNIQUE: FSE (fast-spin-echo) sequences, T1- and T2-weighted, were performed in the sagittal plane and T2-weighted sequences in the axial plane.
TECHNIQUE: FSE (fast-spin-echo) sequences, T1 and T2 weighted, were performed in the sagittal and axial planes.
TECHNIQUE: Examination performed using magnetic resonance imaging equipment with sequences, weightings, and planes specific to the segment of interest.

[Series 401: T2 · sagittal · 3.0mm · 0.43mm/px · 1 of 24 slices shown (1 of 8)]
[im 1/24]
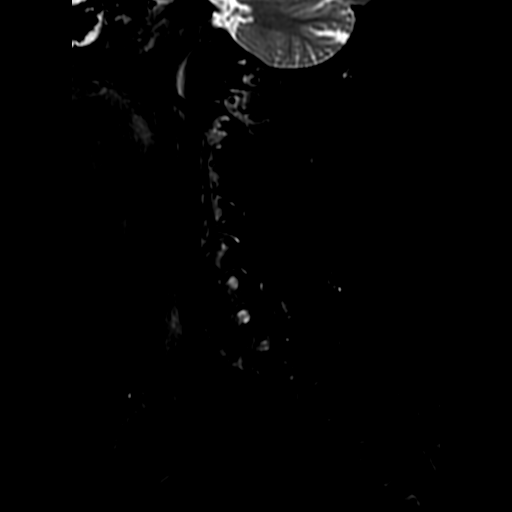

[Series 402: T2 · sagittal · 3.0mm · 0.43mm/px · 1 of 12 slices shown (2 of 8)]
[im 1/12]
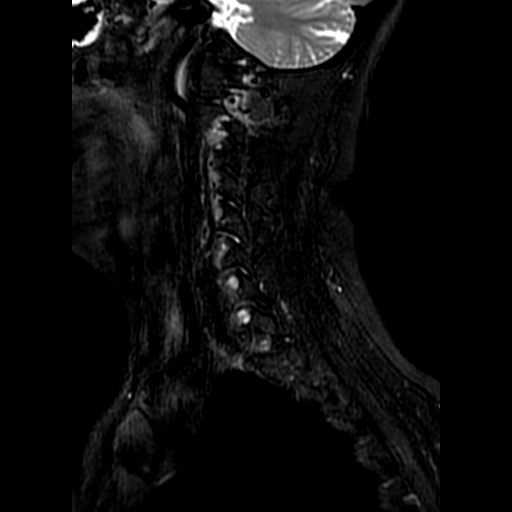

[Series 403: T2 · sagittal · 3.0mm · 0.43mm/px · 1 of 12 slices shown (3 of 8)]
[im 1/12]
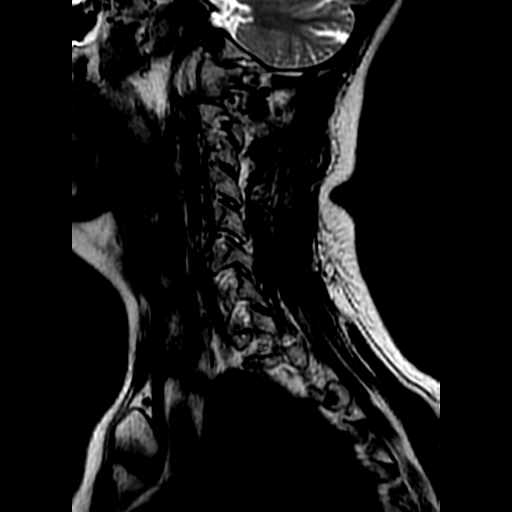

[Series 601: T2 · axial · 3.5mm · 0.41mm/px · 1 of 30 slices shown (4 of 8)]
[im 1/30]
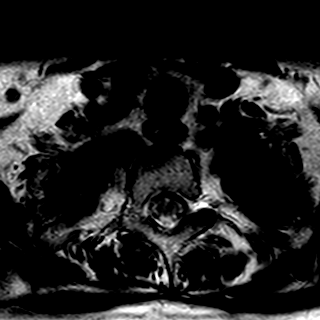

[Series 901: T2 · sagittal · 3.5mm · 0.33mm/px · 1 of 32 slices shown (5 of 8)]
[im 1/32]
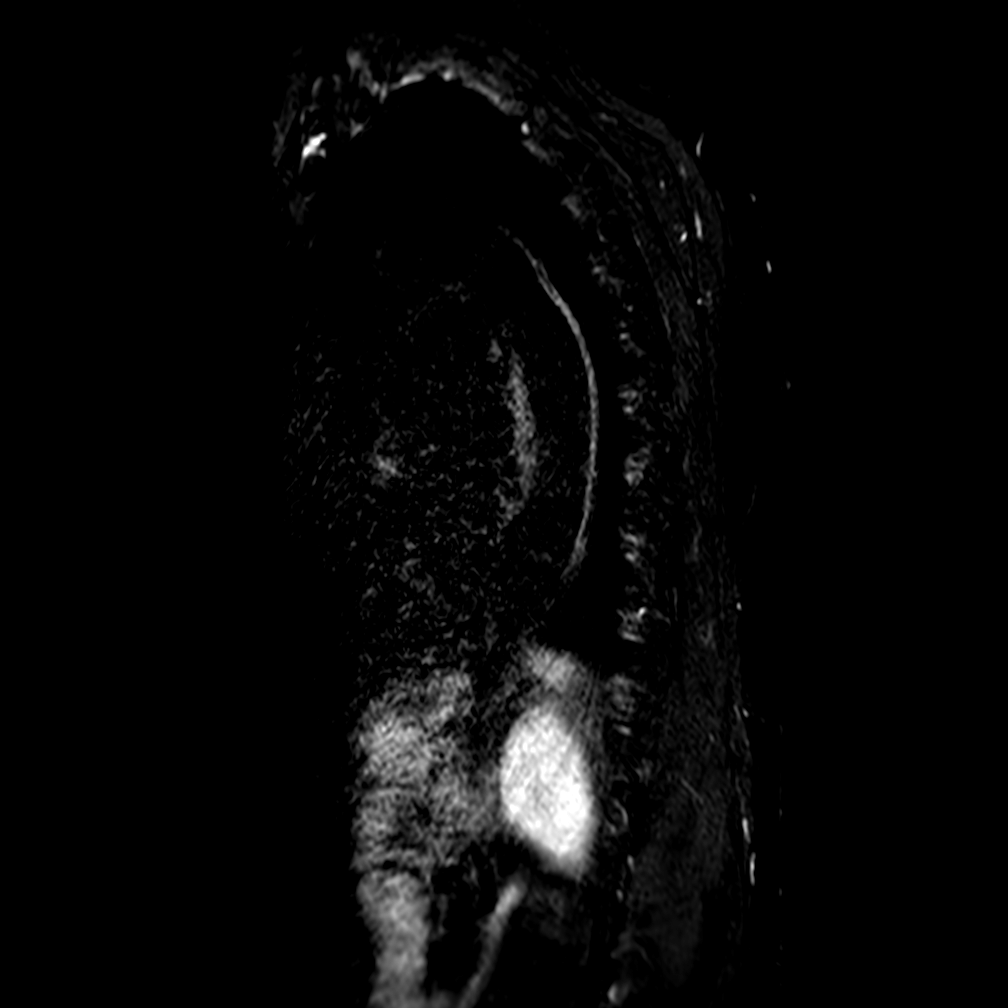

[Series 902: T2 · sagittal · 3.5mm · 0.33mm/px · 1 of 16 slices shown (6 of 8)]
[im 1/16]
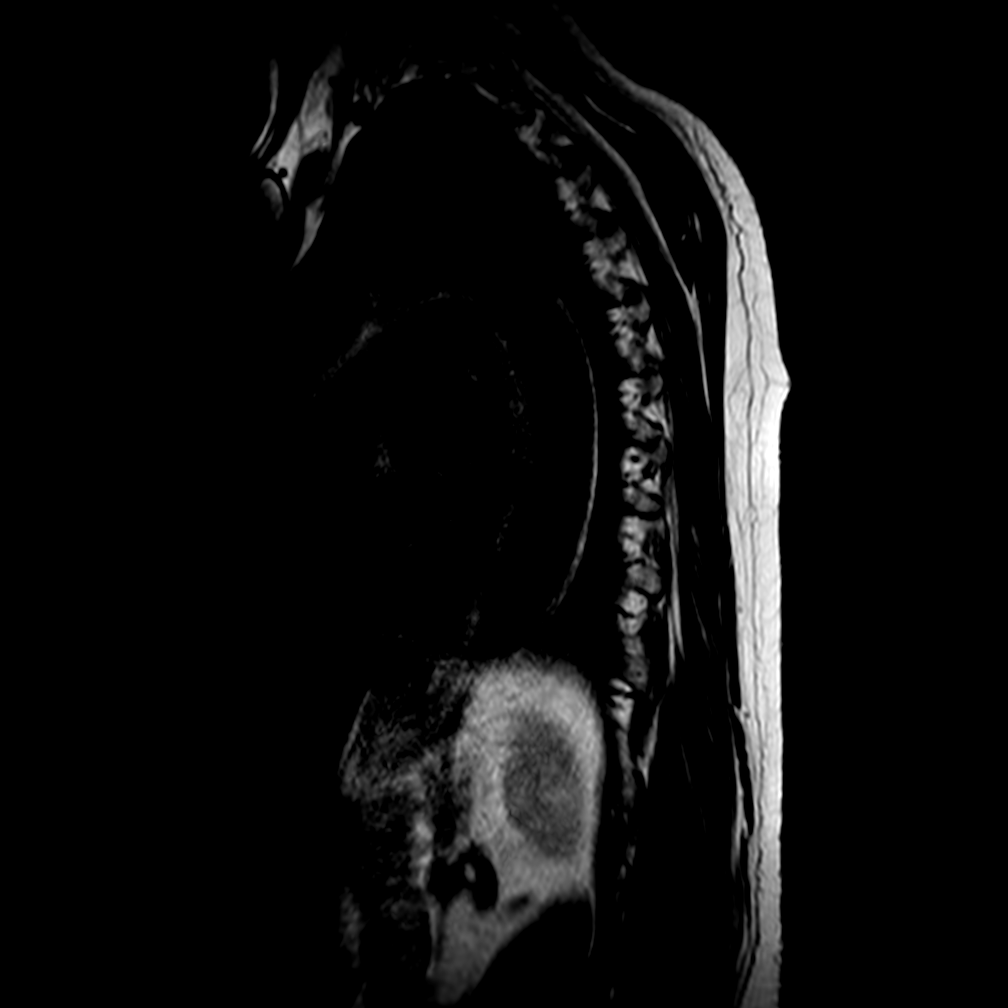

[Series 903: T2 · sagittal · 3.5mm · 0.33mm/px · 1 of 16 slices shown (7 of 8)]
[im 1/16]
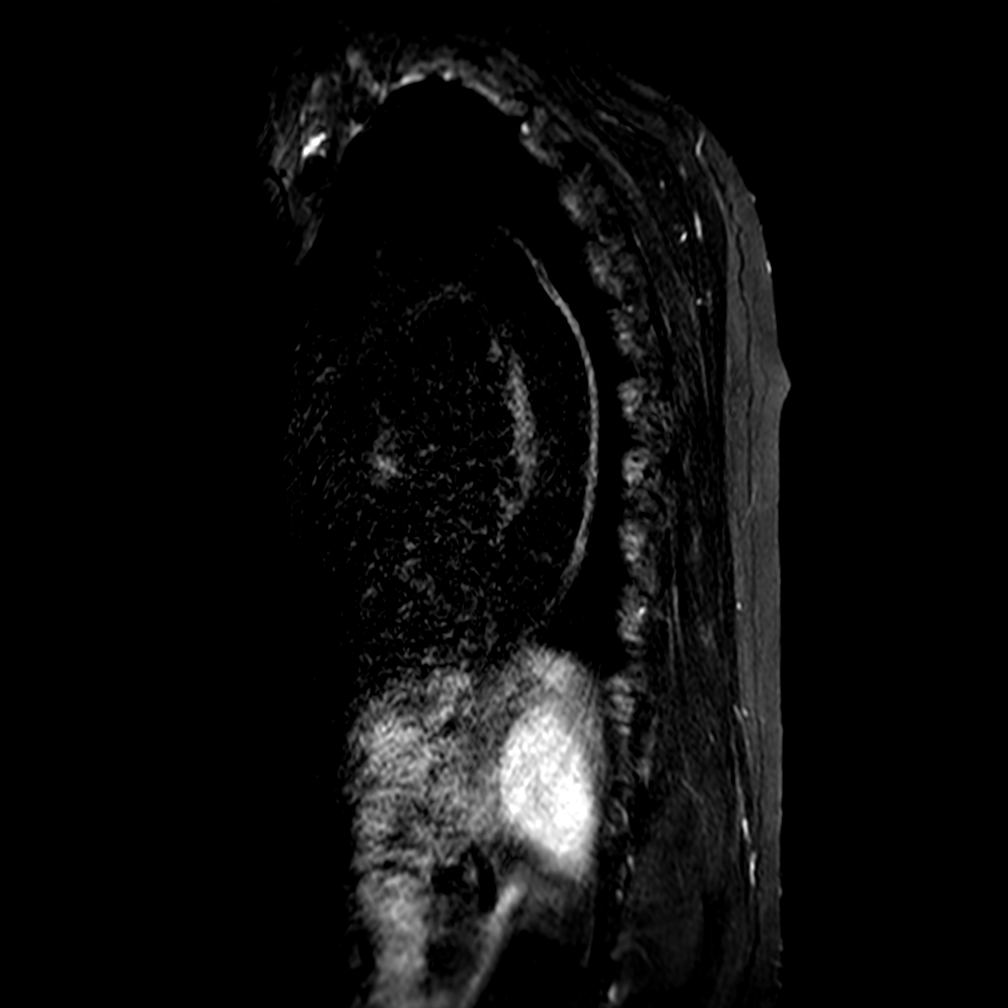

[Series 1101: T2 · axial · 4.0mm · 0.33mm/px · 1 of 72 slices shown (8 of 8)]
[im 1/72]
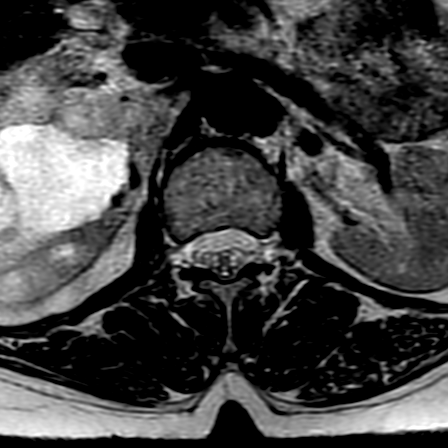

[8 of 48 positions shown; findings below may reference images not displayed]

RESULT:
Good alignment of the cervical vertebral bodies.
Osteophytosis of the cervical vertebral bodies.
Cervical vertebral bodies with preserved heights.
Pedicles, laminae, transverse and spinous processes preserved.
Uncovertebral and facet joints preserved.
Central posterior disc protrusions at C5-C6 and C6-C7, determining compression of the ventral aspect of the dural sac.
Other intervertebral discs without significant changes.
Free cerebrospinal fluid space in the other segments.
Spinal cord with regular contours and homogeneous signal.
Vertebral canal and intervertebral foramina with preserved amplitude in the other segments.
Cranio-cervical transition without alterations.
Paravertebral soft tissues with preserved appearance.
CONCLUSION: Cervical spondylosis.
Degenerative disc disease as described above.


------------- REPORT [id number] -------------
PELVIC MAGNETIC RESONANCE IMAGING
RESULT:
Increased coverage of the bilateral femoral heads.
Degenerative changes in the bilateral hip joints characterized by chondral thinning of the acetabular roofs in the weight-bearing area, with small subcortical degenerative cysts and marginal osteophytes in the acetabula and perifoveal and subcapital regions.
Tendinopathy and peritendinitis of the origin of the bilateral hamstring muscles.
Insertional tendinopathy and peritendinitis of the bilateral gluteus medius and minimus muscles associated with signs of subgluteal medius and minimus and trochanteric bursitis.
Reduction of the ischiofemoral spaces associated with liposubstitution and edema of the muscle bellies of the quadratus femoris muscles, which may represent impingement.
Remaining bone structures with preserved morphology and medullary signal.
Intact round ligaments.
Acetabular labra without gross lesions.
Absence of joint effusion.
Muscle bellies of the pelvic girdle with preserved trophism, without lesions.
No focal abnormalities are identified in the proximal course of the sciatic nerves.
CONCLUSION: Increased coverage of the bilateral femoral heads.
Degenerative changes in the bilateral hip joints.
Tendinopathy and peritendinitis of the origin of the bilateral hamstring muscles.
Insertional tendinopathy and peritendinitis of the bilateral gluteus medius and minimus muscles associated with signs of subgluteal medius and minimus and trochanteric bursitis.
Reduction of the ischiofemoral spaces associated with liposubstitution and edema of the muscle bellies of the quadratus femoris muscles, which may represent impingement.


------------- REPORT [id number] -------------
CERVICAL SPINE MRI
RESULT:
Good alignment of the cervical vertebral bodies.
Osteophytosis of the cervical vertebral bodies.
Cervical vertebral bodies with preserved heights.
Pedicles, laminae, transverse and spinous processes preserved.
Uncovertebral and facet joints preserved.
Posterior central disc protrusions at C5-C6 and C6-C7, causing compression of the ventral aspect of the dural sac.
Other intervertebral discs without significant changes.
Free cerebrospinal fluid space in the other segments.
Spinal cord with regular contours and homogeneous signal.
Vertebral canal and intervertebral foramina with preserved amplitude in the other segments.
Cranio-cervical transition without changes.
Paravertebral soft tissues with preserved appearance.
CONCLUSION: Cervical spondylosis.
Degenerative disc disease as described above.


------------- REPORT [id number] -------------
LUMBOSACRAL SPINE MRI
RESULT:
Good alignment of the lumbar vertebral bodies.
Accentuation of lumbar lordosis in decubitus.
Vertebral bodies with preserved heights.
Osteophytosis in the lumbar vertebral bodies.
Pedicles, laminae, transverse and spinous processes preserved.
Facet joint arthrosis in L5-S1.
Scattered Schmorl's nodes throughout the vertebral endplates.
Signs of diffuse disc dehydration.

Level L1-L2: Symmetrical disc bulging, straightening the ventral aspect of the dural sac, without radicular conflicts.
Level L2-L3: Symmetrical disc bulging, straightening the ventral aspect of the dural sac, without radicular conflicts.
Level L3-L4: Symmetrical disc bulging with fissure of the annulus fibrosus, straightening the ventral aspect of the dural sac, without radicular conflicts.
Level L4-L5: Symmetrical disc bulging with fissure of the annulus fibrosus, straightening the ventral aspect of the dural sac, without radicular conflicts and reducing the foraminal amplitudes, touching the emerging L4 roots.
Level L5-S1: Symmetrical disc bulging, straightening the ventral aspect of the dural sac and reducing the foraminal amplitudes, touching the emerging L5 roots.

Other foraminal nerve roots preserved.
Conus medullaris with regular contours and homogeneous signal.
Paravertebral soft tissues with preserved appearance.
CONCLUSION: Lumbar degenerative spondyloarthropathy.
Degenerative disc disease as described above.


------------- REPORT [id number] -------------
MRI OF THE THORACIC SPINE
RESULT:
Good alignment of the thoracic vertebral bodies.
Accentuation of thoracic kyphosis in the supine position.
Vertebral bodies with preserved heights.
Osteophytosis in the thoracic vertebral bodies.
Pedicles, laminae, transverse and spinous processes preserved.
Interapophyseal joints preserved.
Schmorl's nodes on the inferior vertebral endplates of T7 and T10.
Signs of diffuse disc dehydration.
Central posterior disc protrusions at T8-T9 and T9-T10, straightening the ventral aspect of the dural sac.
Other intervertebral discs without significant changes.
Vertebral canal and intervertebral foramina with preserved amplitude in the remaining segments.
Cerebrospinal fluid space free in the remaining segments.
Foraminal nerve roots preserved.
Spinal cord with regular contours and homogeneous signal.
Paravertebral soft tissues with preserved appearance.
CONCLUSION: Thoracic spondylosis.
Degenerative disc disease as described above.

## 2022-04-12 IMAGING — MR RM - CRANIO/ENCEFALO
11 of 14 series · 21 of 48 positions shown · non-contrast
Comparison: none

------------- REPORT [id number] -------------
MRI OF THE SKULL
TECHNIQUE: Exam performed on magnetic resonance imaging equipment with sequences, weightings, and planes specific to the segment of interest, without intravenous administration of contrast medium.
TECHNIQUE: Examination performed on magnetic resonance imaging equipment with sequences, weightings, and planes specific to the segment of interest, without intravenous administration of contrast medium.

[Series 202: T1 · axial · 5.0mm · 0.23mm/px · 1 of 26 slices shown]
[im 1/26]
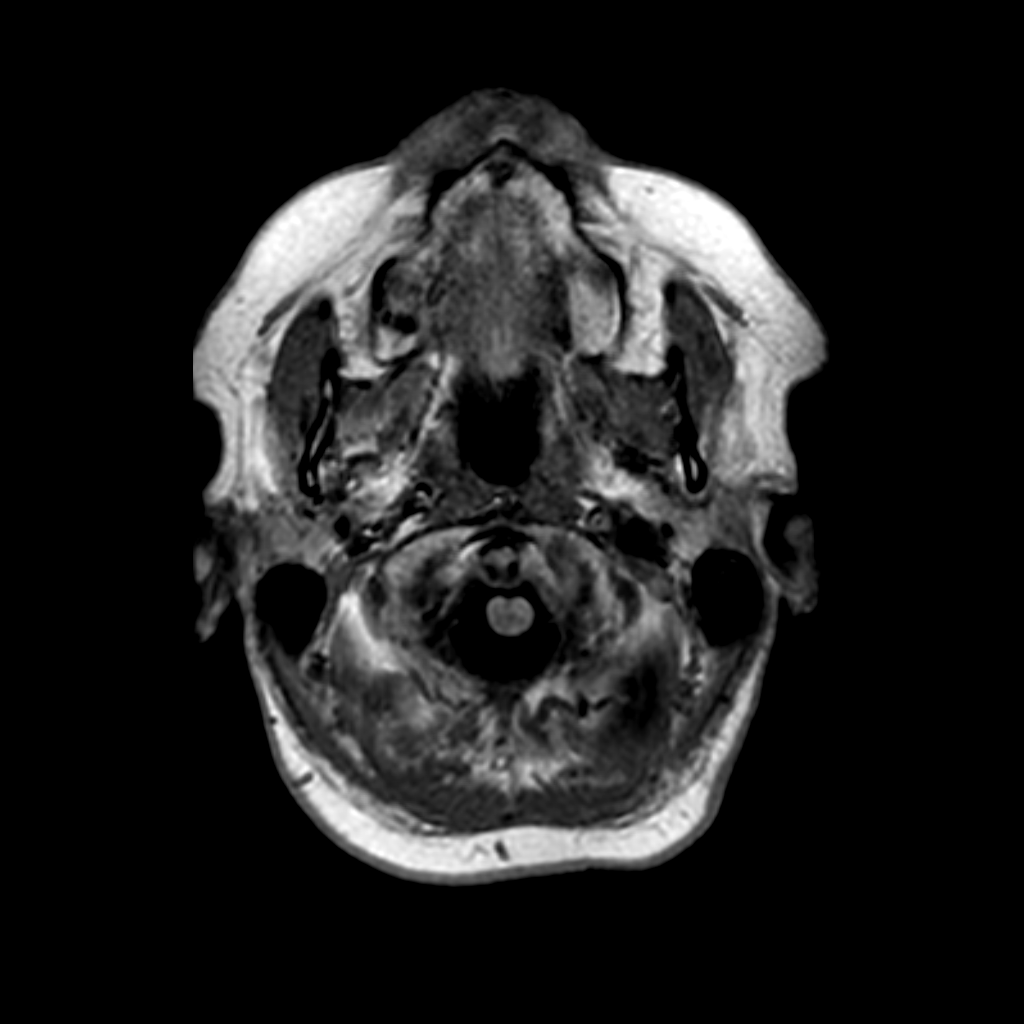

[Series 301: T2 · coronal · 5.0mm · 0.38mm/px · 2 of 30 slices shown (1 of 2)]
[im 1/30]
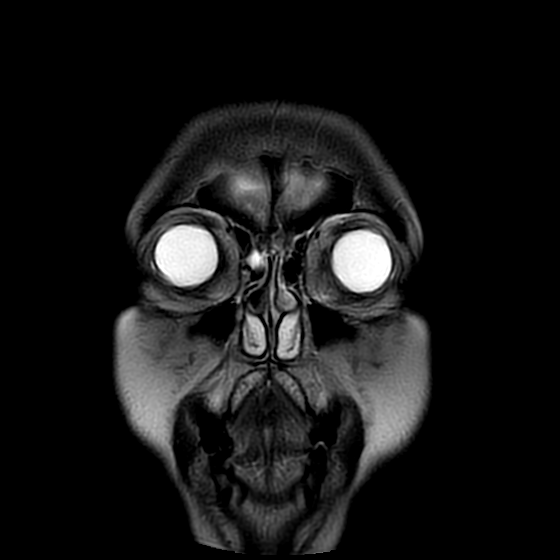
[im 30/30]
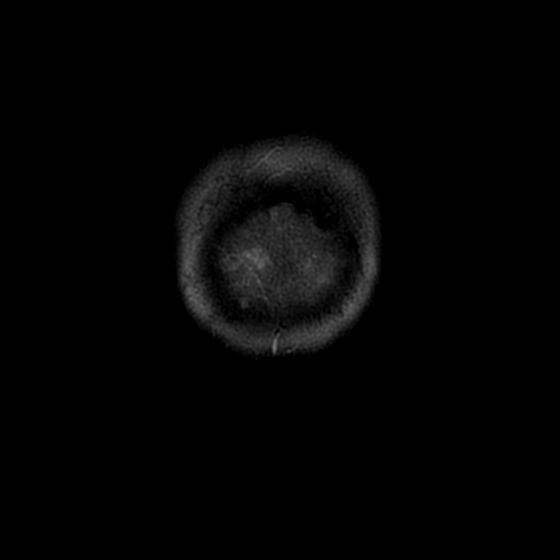

[Series 401: T2 · axial · 5.0mm · 0.33mm/px · 1 of 28 slices shown (2 of 2)]
[im 1/28]
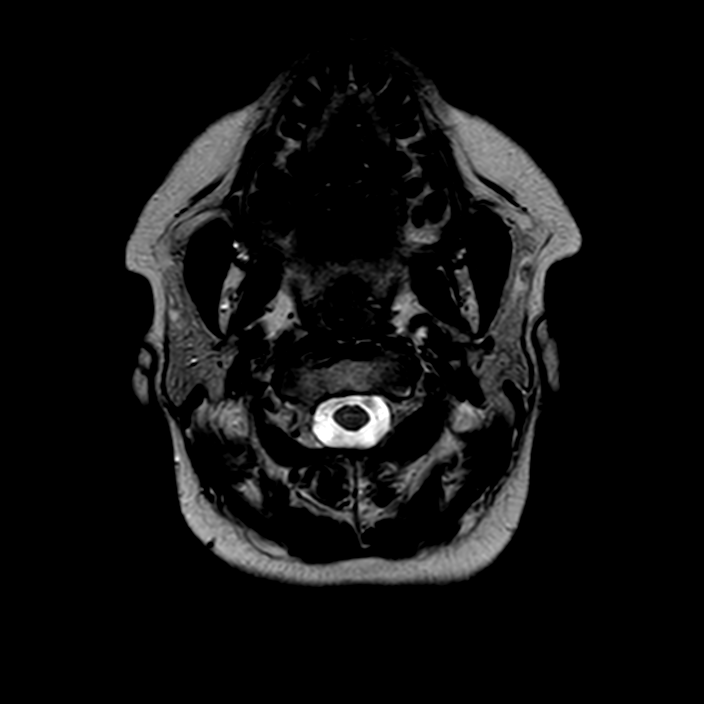

[Series 501: axi difusao · axial · 5.0mm · 0.79mm/px · z∈[-46,+90]mm · 3 of 52 slices shown]
[im 1/52]
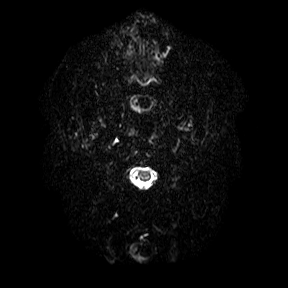
[im 26/52]
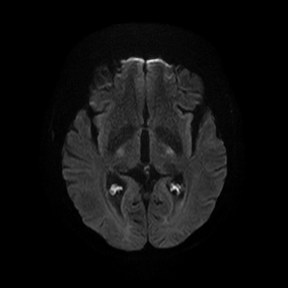
[im 52/52]
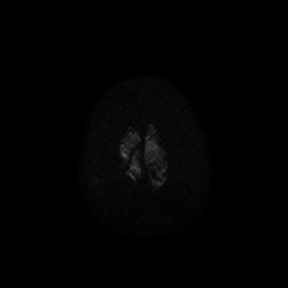

[Series 502: reg - rdifusao · axial · 5.0mm · 0.79mm/px · z∈[-46,+90]mm · 3 of 52 slices shown]
[im 1/52]
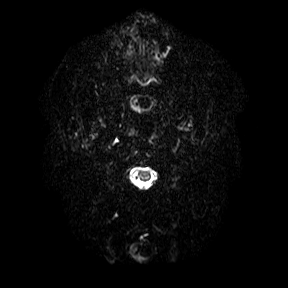
[im 26/52]
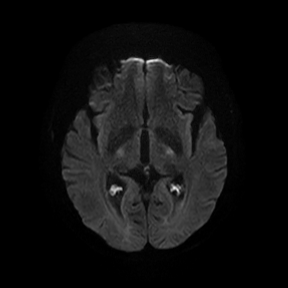
[im 52/52]
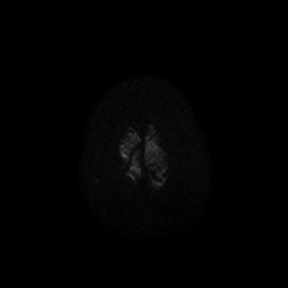

[Series 503: dadc · axial · 5.0mm · 0.79mm/px · 1 of 25 slices shown]
[im 1/25]
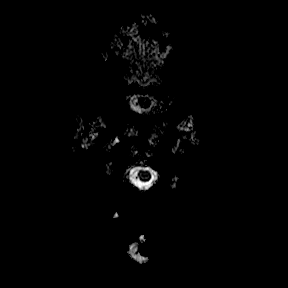

[Series 504: eeadc · axial · 5.0mm · 0.79mm/px · 1 of 19 slices shown]
[im 1/19]
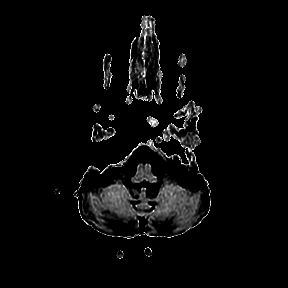

[Series 601: FLAIR · axial · 5.0mm · 0.60mm/px · 1 of 28 slices shown]
[im 1/28]
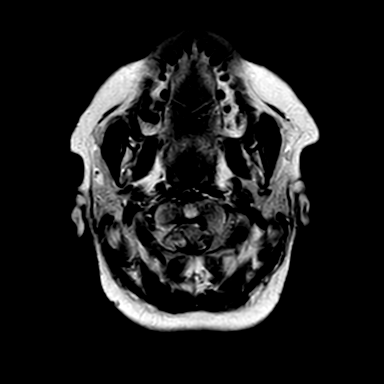

[Series 701: sag t1_se · sagittal · 5.0mm · 0.68mm/px · 1 of 24 slices shown]
[im 1/24]
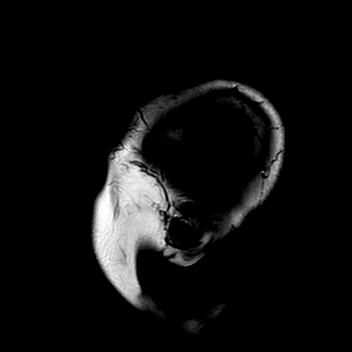

[Series 801: ax t1_se · axial · 5.0mm · 0.24mm/px · 1 of 28 slices shown]
[im 1/28]
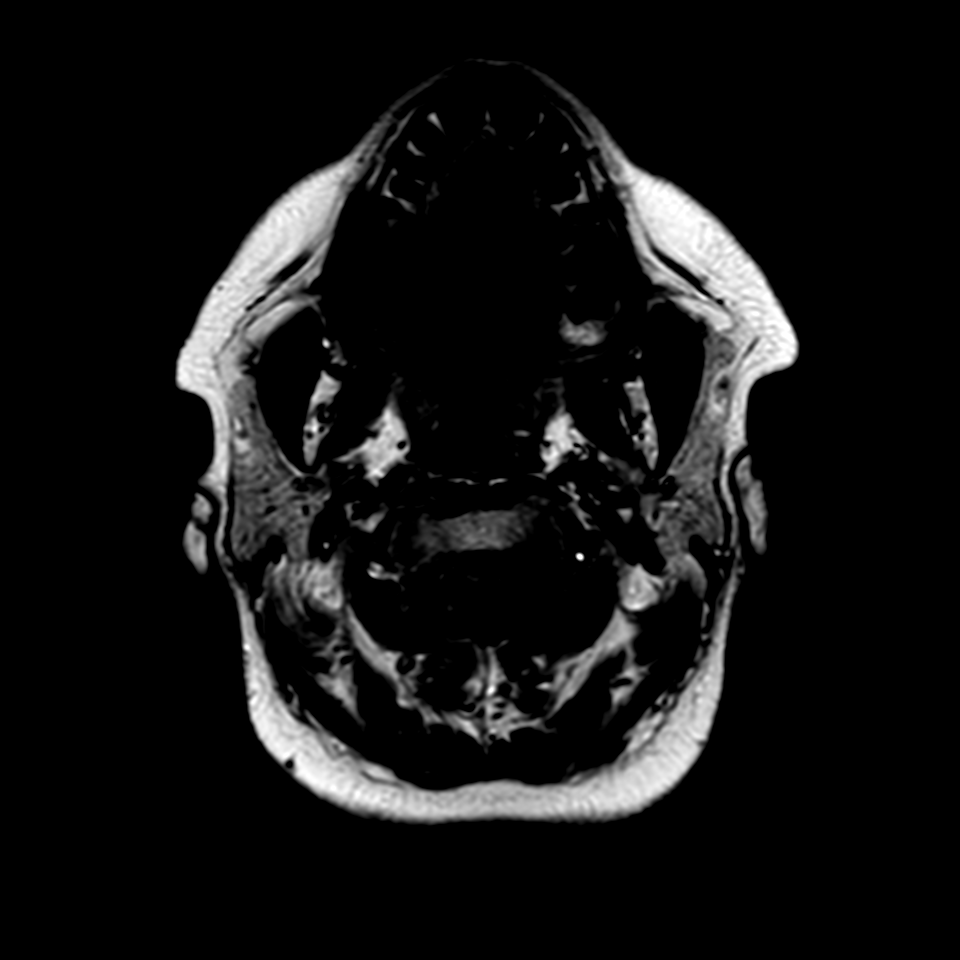

[Series 901: axi swip · axial · 2.5mm · 0.60mm/px · z∈[-56,+51]mm · 6 of 240 slices shown]
[im 1/240]
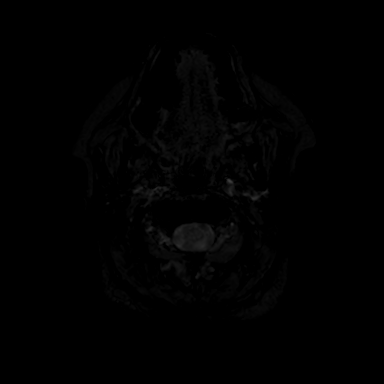
[im 44/240]
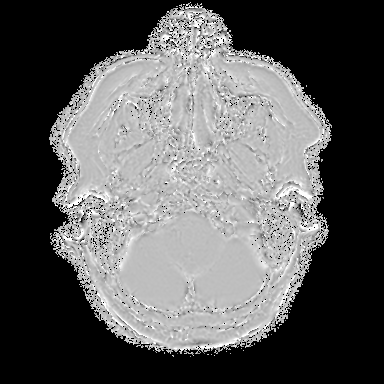
[im 66/240]
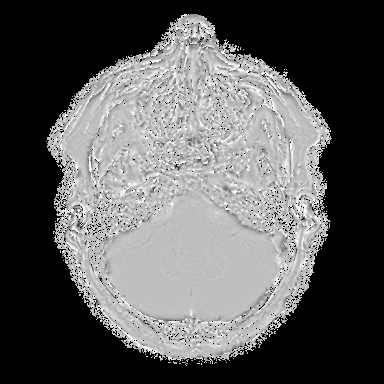
[im 109/240]
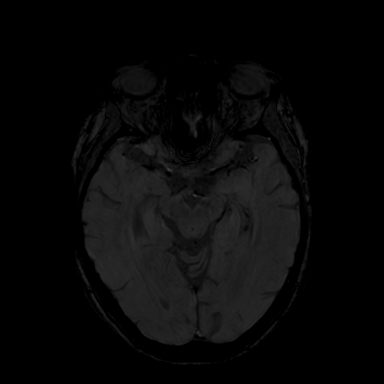
[im 131/240]
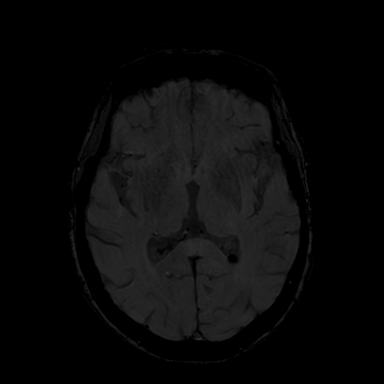
[im 174/240]
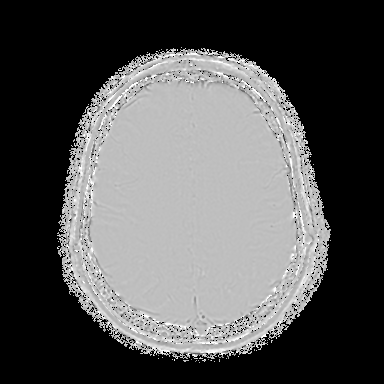

[21 of 48 positions shown; findings below may reference images not displayed]

RESULT:
Diffuse encephalic volumetric reduction with compensatory dilation of the subarachnoid space.
Foci of T2 hypersignal distributed throughout the supratentorial white matter, without determining significant atrophic or expansive effect, suggesting gliosis due to microangiopathy.
The remaining cerebral parenchyma has normal signal intensity.
Absence of hypertensive hydrocephalus.
Brainstem and cerebellum showing normal shape and signal intensity.
No signs of intracranial expansive process.
Corpus callosum of normal morphology and thickness.
The echo-planar sequence does not show foci of restriction to passive water diffusion.
CONCLUSION: Diffuse encephalic volumetric reduction, usual for the age group.
Probable gliosis due to mild microangiopathy in the supratentorial white matter (Jim 1).


------------- REPORT [id number] -------------
MRI OF THE SKULL
RESULT:
Diffuse encephalic volumetric reduction with compensatory dilation of the subarachnoid space.
Foci of T2 hypersignal distributed throughout the supratentorial white matter, without determining significant atrophic or expansive effect, suggesting gliosis due to microangiopathy.
The remainder of the cerebral parenchyma has normal signal intensity.
Absence of hypertensive hydrocephalus.
Brainstem and cerebellum presenting normal shape and signal intensity.
No signs of intracranial expansive process.
Corpus callosum of normal morphology and thicknesses.
The echo-planar sequence does not show foci of restriction to passive water diffusion.
CONCLUSION: Diffuse encephalic volumetric reduction, habitual for the age group.
Probable gliosis due to mild microangiopathy in the supratentorial white matter (Haje 1).

## 2022-04-12 IMAGING — MR RM - COXAS DIREITA / ESQUERDA
3 of 15 series · 6 of 40 positions shown · non-contrast
Comparison: none

------------- REPORT [id number] -------------
MRI OF THE RIGHT THIGH
TECHNIQUE: Examination performed on magnetic resonance imaging equipment with sequences, weightings, and plans specific to the segment of interest, without the use of contrast medium.

RESULT:
Bone structures with normal signal characteristics.
Contours and joint lines maintained.
Absence of significant joint effusion.
Myotendinous planes with adequate trophism for the age group.
Maintained contours and joint lines.
TECHNIQUE: Examination performed on magnetic resonance imaging equipment with sequences, weightings, and planes specific to the segment of interest, without the use of contrast medium.

[Series 301: T1 · coronal · 6.0mm · 0.53mm/px · 3 of 60 slices shown (1 of 3)]
[im 1/60]
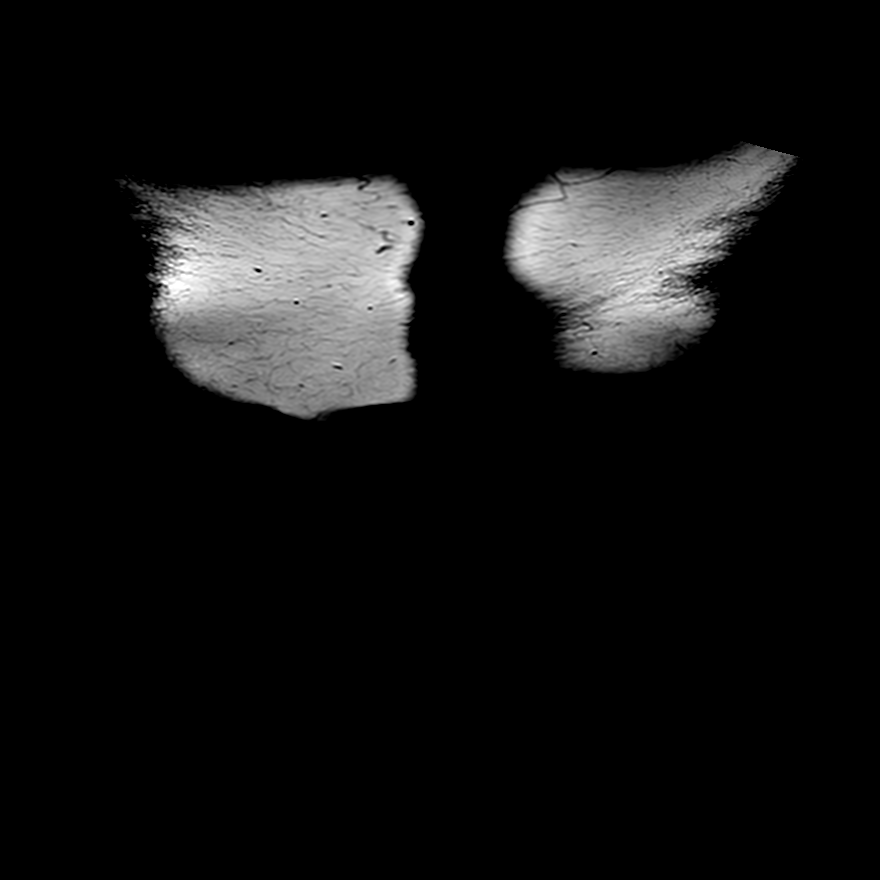
[im 40/60]
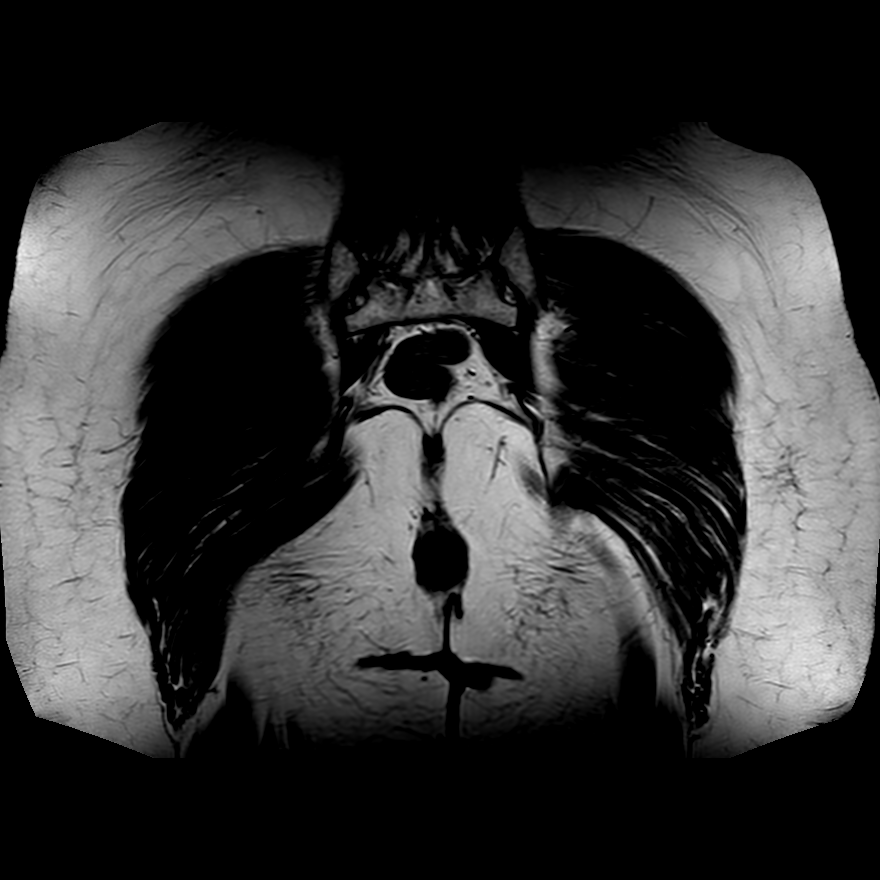
[im 60/60]
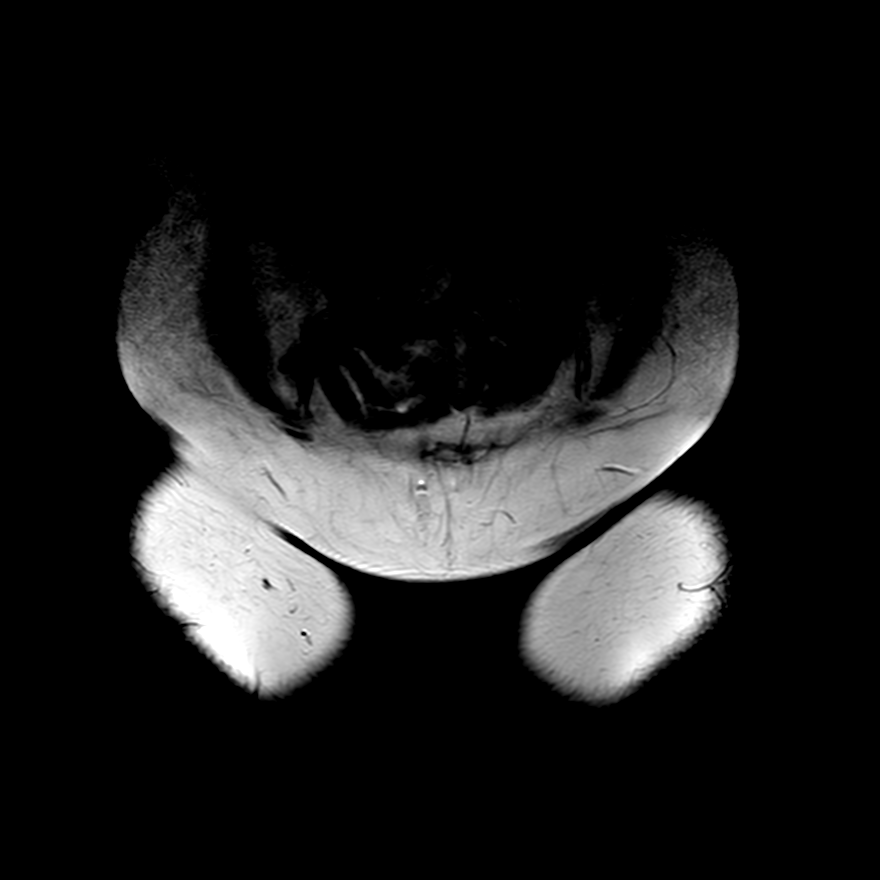

[Series 302: T1 · coronal · 6.0mm · 0.53mm/px · 1 of 30 slices shown (2 of 3)]
[im 1/30]
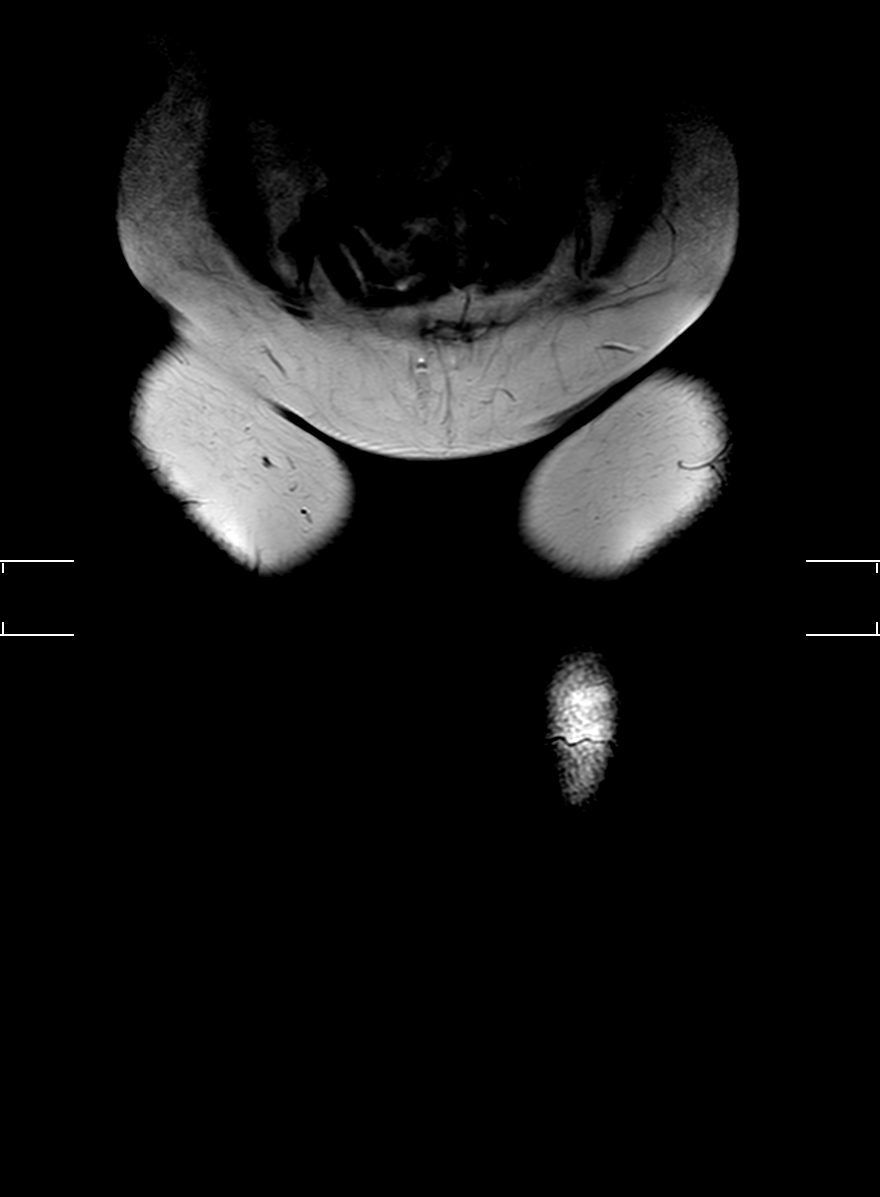

[Series 801: T1 · axial · 5.0mm · 0.31mm/px · z∈[-219,+2]mm · 2 of 76 slices shown (3 of 3)]
[im 1/76]
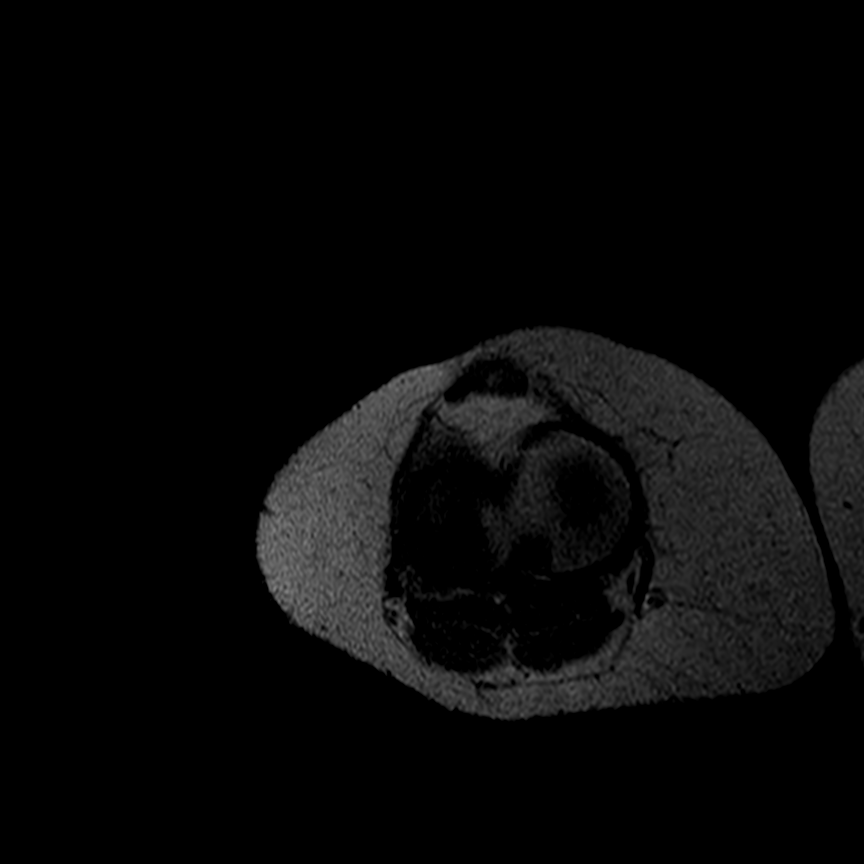
[im 38/76]
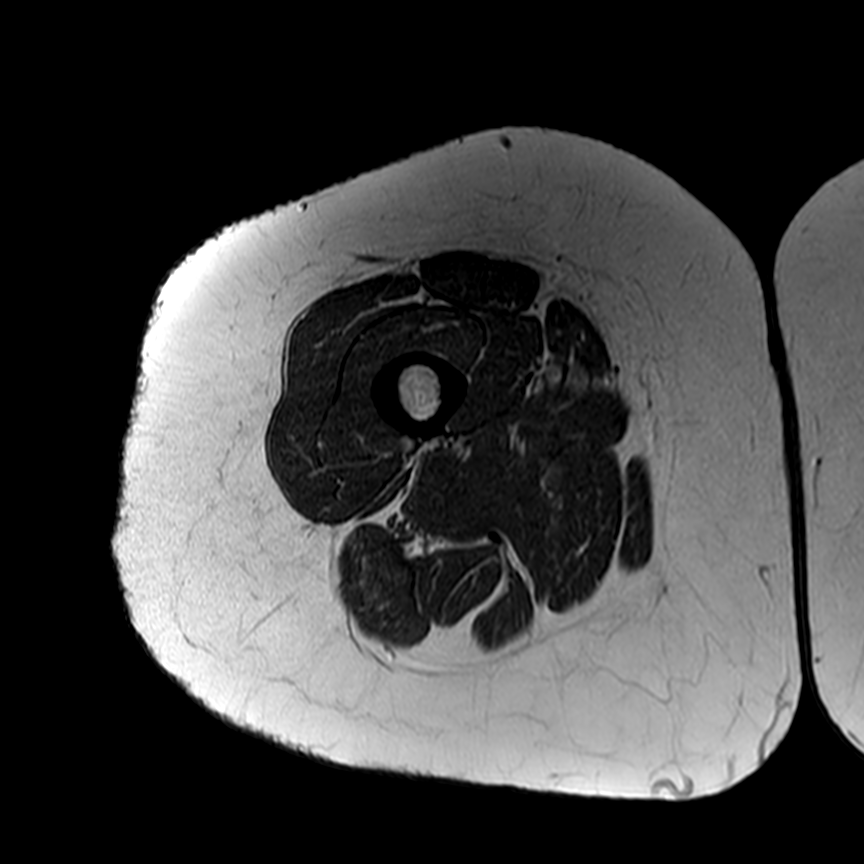

[6 of 40 positions shown; findings below may reference images not displayed]

CONCLUSION: Magnetic resonance imaging without evident changes.


------------- REPORT [id number] -------------
MRI OF THE RIGHT THIGH
CONCLUSION: Magnetic resonance imaging without evident alterations.


------------- REPORT [id number] -------------
MRI OF THE LEFT THIGH
CONCLUSION: Magnetic resonance imaging without evident alterations.
